# Patient Record
Sex: Female | Born: 1935 | Race: White | Hispanic: No | Smoking: Never smoker
Health system: Southern US, Community
[De-identification: ages and names within clinical notes are randomized; demographics above are authoritative.]

## PROBLEM LIST (undated history)

## (undated) DIAGNOSIS — R296 Repeated falls: Secondary | ICD-10-CM

## (undated) DIAGNOSIS — I10 Essential (primary) hypertension: Secondary | ICD-10-CM

## (undated) DIAGNOSIS — I350 Nonrheumatic aortic (valve) stenosis: Secondary | ICD-10-CM

## (undated) DIAGNOSIS — N182 Chronic kidney disease, stage 2 (mild): Secondary | ICD-10-CM

## (undated) DIAGNOSIS — F0390 Unspecified dementia without behavioral disturbance: Secondary | ICD-10-CM

## (undated) DIAGNOSIS — C50919 Malignant neoplasm of unspecified site of unspecified female breast: Secondary | ICD-10-CM

## (undated) HISTORY — PX: THYROIDECTOMY: SHX17

## (undated) HISTORY — PX: BREAST LUMPECTOMY: SHX2

## (undated) HISTORY — PX: CHOLECYSTECTOMY: SHX55

## (undated) HISTORY — PX: KNEE SURGERY: SHX244

---

## 2015-10-19 ENCOUNTER — Emergency Department: Payer: Medicare HMO

## 2015-10-19 ENCOUNTER — Emergency Department
Admission: EM | Admit: 2015-10-19 | Discharge: 2015-10-19 | Disposition: A | Discharge status: Home / Self Care | Payer: Medicare HMO | Attending: Student | Admitting: Student

## 2015-10-19 ENCOUNTER — Encounter: Payer: Self-pay | Admitting: *Deleted

## 2015-10-19 DIAGNOSIS — R1032 Left lower quadrant pain: Secondary | ICD-10-CM | POA: Diagnosis not present

## 2015-10-19 DIAGNOSIS — C801 Malignant (primary) neoplasm, unspecified: Secondary | ICD-10-CM | POA: Insufficient documentation

## 2015-10-19 DIAGNOSIS — I10 Essential (primary) hypertension: Secondary | ICD-10-CM | POA: Diagnosis not present

## 2015-10-19 HISTORY — DX: Essential (primary) hypertension: I10

## 2015-10-19 LAB — CBC
HCT: 45.1 % (ref 35.0–47.0)
HEMOGLOBIN: 15.2 g/dL (ref 12.0–16.0)
MCH: 29.6 pg (ref 26.0–34.0)
MCHC: 33.8 g/dL (ref 32.0–36.0)
MCV: 87.6 fL (ref 80.0–100.0)
Platelets: 151 10*3/uL (ref 150–440)
RBC: 5.15 MIL/uL (ref 3.80–5.20)
RDW: 13.9 % (ref 11.5–14.5)
WBC: 10.6 10*3/uL (ref 3.6–11.0)

## 2015-10-19 LAB — COMPREHENSIVE METABOLIC PANEL
ALBUMIN: 4.6 g/dL (ref 3.5–5.0)
ALK PHOS: 46 U/L (ref 38–126)
ALT: 25 U/L (ref 14–54)
ANION GAP: 8 (ref 5–15)
AST: 27 U/L (ref 15–41)
BILIRUBIN TOTAL: 0.7 mg/dL (ref 0.3–1.2)
BUN: 27 mg/dL — ABNORMAL HIGH (ref 6–20)
CALCIUM: 10.3 mg/dL (ref 8.9–10.3)
CO2: 26 mmol/L (ref 22–32)
Chloride: 105 mmol/L (ref 101–111)
Creatinine, Ser: 1.27 mg/dL — ABNORMAL HIGH (ref 0.44–1.00)
GFR calc Af Amer: 43 mL/min — ABNORMAL LOW (ref >60)
GFR, EST NON AFRICAN AMERICAN: 37 mL/min — AB (ref >60)
GLUCOSE: 115 mg/dL — AB (ref 65–99)
Potassium: 4.1 mmol/L (ref 3.5–5.1)
Sodium: 139 mmol/L (ref 135–145)
TOTAL PROTEIN: 7.2 g/dL (ref 6.5–8.1)

## 2015-10-19 LAB — URINALYSIS COMPLETE WITH MICROSCOPIC (ARMC ONLY)
Bacteria, UA: NONE SEEN
Bilirubin Urine: NEGATIVE
GLUCOSE, UA: NEGATIVE mg/dL
Hgb urine dipstick: NEGATIVE
KETONES UR: NEGATIVE mg/dL
NITRITE: NEGATIVE
Protein, ur: 30 mg/dL — AB
SPECIFIC GRAVITY, URINE: 1.008 (ref 1.005–1.030)
Squamous Epithelial / LPF: NONE SEEN
pH: 6 (ref 5.0–8.0)

## 2015-10-19 LAB — LIPASE, BLOOD: Lipase: 41 U/L (ref 11–51)

## 2015-10-19 LAB — TROPONIN I: Troponin I: <0.03 ng/mL (ref <0.031)

## 2015-10-19 MED ORDER — IOPAMIDOL (ISOVUE-300) INJECTION 61%
75.0000 mL | Freq: Once | INTRAVENOUS | Status: AC | PRN
  Administered 2015-10-19: 75 mL via INTRAVENOUS

## 2015-10-19 MED ORDER — DIATRIZOATE MEGLUMINE & SODIUM 66-10 % PO SOLN
15.0000 mL | Freq: Once | ORAL | Status: AC
  Administered 2015-10-19: 15 mL via ORAL

## 2015-10-19 MED ORDER — SODIUM CHLORIDE 0.9 % IV BOLUS (SEPSIS)
500.0000 mL | Freq: Once | INTRAVENOUS | Status: AC
  Administered 2015-10-19: 500 mL via INTRAVENOUS

## 2015-10-19 NOTE — ED Notes (Signed)
Pt woke up with right lower abdominal pain, pt denies any other symptoms,pt is in no distress

## 2015-10-19 NOTE — ED Provider Notes (Signed)
[facility] Emergency Department Provider Note   ____________________________________________  Time seen: Approximately 9:53 AM  I have reviewed the triage vital signs and the nursing notes.   HISTORY  Chief Complaint Abdominal Pain    HPI Kristin Brewer is a 82 y.o. female with history of hypertension, CKD, hyperlipidemia, kidney stones, who presents for evaluation of left lower abdominal pain today, gradual onset, constant, initially moderate, now mild, no modifying factors. No nausea, vomiting, diarrhea, fevers or chills. No chest pain or difficulty breathing. No fevers.    Past Medical History  Diagnosis Date  . Hypertension   . Kidney disease   . Renal insufficiency   . Cancer ([place])     There are no active problems to display for this patient.   History reviewed. No pertinent past surgical history.  No current outpatient prescriptions on file.  Allergies Penicillins  No family history on file.  Social History Social History  Substance Use Topics  . Smoking status: Never Smoker   . Smokeless tobacco: None  . Alcohol Use: Yes    Review of Systems Constitutional: No fever/chills Eyes: No visual changes. ENT: No sore throat. Cardiovascular: Denies chest pain. Respiratory: Denies shortness of breath. Gastrointestinal: + abdominal pain.  No nausea, no vomiting.  No diarrhea.  No constipation. Genitourinary: Negative for dysuria. Musculoskeletal: Negative for back pain. Skin: Negative for rash. Neurological: Negative for headaches, focal weakness or numbness.  10-point ROS otherwise negative.  ____________________________________________   PHYSICAL EXAM:  Filed Vitals:   10/19/15 0932 10/19/15 1200  BP: 189/59 134/43  Pulse: 51 48  Temp: 97.8 F (36.6 C)   TempSrc: Oral   Resp: 20 11  Height: 4\' 9"  (1.448 m)   Weight: 120 lb (54.432 kg)   SpO2: 98% 97%    VITAL SIGNS: ED Triage Vitals  Enc Vitals Group     BP  10/19/15 0932 189/59 mmHg     Pulse Rate 10/19/15 0932 51     Resp 10/19/15 0932 20     Temp --      Temp src --      SpO2 10/19/15 0932 98 %     Weight 10/19/15 0932 120 lb (54.432 kg)     Height 10/19/15 0932 4\' 9"  (1.448 m)     Head Cir --      Peak Flow --      Pain Score 10/19/15 0933 4     Pain Loc --      Pain Edu? --      Excl. in [place]? --     Constitutional: Alert and oriented. Well appearing and in no acute distress. Eyes: Conjunctivae are normal. PERRL. EOMI. Head: Atraumatic. Nose: No congestion/rhinnorhea. Mouth/Throat: Mucous membranes are moist.  Oropharynx non-erythematous. Neck: No stridor.  Supple without meningismus. Cardiovascular: Normal rate, regular rhythm. Grossly normal heart sounds.  Good peripheral circulation. Respiratory: Normal respiratory effort.  No retractions. Lungs CTAB. Gastrointestinal: Soft with moderate tenderness to palpation in the left lower quadrant and left mid abdomen. No CVA tenderness. Genitourinary: deferred Musculoskeletal: No lower extremity tenderness nor edema.  No joint effusions. Neurologic:  Normal speech and language. No gross focal neurologic deficits are appreciated. No gait instability. Skin:  Skin is warm, dry and intact. No rash noted. Psychiatric: Mood and affect are normal. Speech and behavior are normal.  ____________________________________________   LABS (all labs ordered are listed, but only abnormal results are displayed)  Labs Reviewed  COMPREHENSIVE METABOLIC PANEL - Abnormal; Notable  for the following:    Glucose, Bld 115 (*)    BUN 27 (*)    Creatinine, Ser 1.27 (*)    GFR calc non Af Amer 37 (*)    GFR calc Af Amer 43 (*)    All other components within normal limits  URINALYSIS COMPLETEWITH MICROSCOPIC ([facility] ONLY) - Abnormal; Notable for the following:    Color, Urine YELLOW (*)    APPearance CLEAR (*)    Protein, ur 30 (*)    Leukocytes, UA TRACE (*)    All other components within normal limits    LIPASE, BLOOD  CBC  TROPONIN I   ____________________________________________  EKG  ED ECG REPORT I, Joanne Gavel, the attending physician, personally viewed and interpreted this ECG.   Date: 10/19/2015  EKG Time: 09:36  Rate: 50  Rhythm: sinus bradycardia  Axis: normal  Intervals:none  ST&T Change: No acute ST elevation or ST depression.  ____________________________________________  RADIOLOGY  CT abdomen and pelvis IMPRESSION: 1. No explanation for patient's acute left lower quadrant abdominal pain. Specifically, no evidence of enteric or urinary obstruction. 2. Colonic diverticulosis without evidence of diverticulitis. 3. Extensive irregular predominantly calcified atherosclerotic plaque throughout the abdominal aorta and involving all major branch vessels of the abdominal aorta. The major branch vessels of the abdominal aorta appear patent though hemodynamically significant stenoses are not excluded on the basis of this non CTA examination. 4. Suspected hemodynamically significant narrowings involving the right common and the left superficial femoral arteries, incompletely evaluated on this non CTA examination. ____________________________________________   PROCEDURES  Procedure(s) performed: None  Critical Care performed: No  ____________________________________________   INITIAL IMPRESSION / ASSESSMENT AND PLAN / ED COURSE  Pertinent labs & imaging results that were available during my care of the patient were reviewed by me and considered in my medical decision making (see chart for details).  Kristin Brewer is a 82 y.o. female with history of hypertension, CKD, hyperlipidemia, kidney stones, who presents for evaluation of left lower abdominal pain today. On exam, she is well-appearing and in no acute distress, vital signs stable, she is afebrile. She does have tenderness to palpation throughout the left lower abdomen without rebound or guarding, we'll  obtain screening labs, CT of the abdomen and pelvis, reassess for disposition.  ----------------------------------------- 2:31 PM on 10/19/2015 ----------------------------------------- Patient continues to appear comfortable, she has no pain complaints at this time. I reviewed her labs, creatinine is mildly elevated at 1.27 however on chart review, this is her baseline. Unremarkable CMP, lipase, negative troponin. Urinalysis is not consistent with infection. CT scan of the abdomen and pelvis shows no acute cause for her pain and her pain has since resolved. We discussed that there are atherosclerotic calcifications throughout several arterieshowever the patient does not have any claudication, she has dopplered PT pulses bilaterally, and I encouraged her to follow up with vascular surgery for evaluation. I Discussed that the exact cause of her pain is not known and that she needs to follow up quickly with her primary care doctor, she has a previously scheduled appointment to be seen by Dr. Doy Hutching on 10/23/15. We discussed immediate return precautions, need for close follow-up and she and her daughter at bedside are comfortable with discharge plan. DC home.  ____________________________________________   FINAL CLINICAL IMPRESSION(S) / ED DIAGNOSES  Final diagnoses:  Left lower quadrant pain      NEW MEDICATIONS STARTED DURING THIS VISIT:  New Prescriptions   No medications on file  Note:  This document was prepared using Dragon voice recognition software and may include unintentional dictation errors.    Joanne Gavel, MD 10/19/15 1435

## 2016-06-14 ENCOUNTER — Encounter (INDEPENDENT_AMBULATORY_CARE_PROVIDER_SITE_OTHER): Payer: Self-pay | Admitting: Vascular Surgery

## 2016-06-14 ENCOUNTER — Other Ambulatory Visit (INDEPENDENT_AMBULATORY_CARE_PROVIDER_SITE_OTHER): Payer: Self-pay | Admitting: Vascular Surgery

## 2016-06-14 ENCOUNTER — Other Ambulatory Visit (INDEPENDENT_AMBULATORY_CARE_PROVIDER_SITE_OTHER): Payer: Medicare HMO

## 2016-06-14 ENCOUNTER — Ambulatory Visit (INDEPENDENT_AMBULATORY_CARE_PROVIDER_SITE_OTHER): Payer: Medicare HMO | Admitting: Vascular Surgery

## 2016-06-14 VITALS — BP 142/68 | HR 52 | Resp 15 | Ht 60.0 in | Wt 119.0 lb

## 2016-06-14 DIAGNOSIS — I10 Essential (primary) hypertension: Secondary | ICD-10-CM

## 2016-06-14 DIAGNOSIS — I779 Disorder of arteries and arterioles, unspecified: Secondary | ICD-10-CM | POA: Diagnosis not present

## 2016-06-14 DIAGNOSIS — I700 Atherosclerosis of aorta: Secondary | ICD-10-CM

## 2016-06-14 DIAGNOSIS — I739 Peripheral vascular disease, unspecified: Secondary | ICD-10-CM

## 2016-06-14 NOTE — Assessment & Plan Note (Signed)
This was checked about 5-6 months ago and found to be in the 40-59% range bilaterally. Scheduled to be rechecked in August. No new symptoms. Continue aspirin therapy.

## 2016-06-14 NOTE — Assessment & Plan Note (Signed)
blood pressure control important in reducing the progression of atherosclerotic disease. On appropriate oral medications.  

## 2016-06-14 NOTE — Assessment & Plan Note (Signed)
Her noninvasive studies today suggest possible greater than 50% stenosis of the mid and distal aorta and the proximal common iliac arteries, but good biphasic waveforms are seen beyond these areas. Her ABIs are 0.67 on the right and 0.70 on the left but reasonably good waveforms are seen distally. No lifestyle limiting symptoms. Plan to recheck in about 6 months. Continue aspirin therapy.

## 2016-06-14 NOTE — Progress Notes (Signed)
MRN : [id number]  Kristin Brewer is a 83 y.o. (Mar 09, 1927) female who presents with chief complaint of  Chief Complaint  Patient presents with  . Re-evaluation    6 month ultrasound follow up  .  History of Present Illness: Patient returns today in follow up of Peripheral vascular disease. She reports arthritic pain in her knees and occasional left hip and flank pain, but no lifestyle limiting claudication, ischemic rest pain, ulceration, or gangrenous changes to her lower extremities. Her noninvasive studies today suggest possible greater than 50% stenosis of the mid and distal aorta and the proximal common iliac arteries, but good biphasic waveforms are seen beyond these areas. Her ABIs are 0.67 on the right and 0.70 on the left but reasonably good waveforms are seen distally.  Current Outpatient Prescriptions  Medication Sig Dispense Refill  . amLODipine (NORVASC) 10 MG tablet Take by mouth.    Marland Kitchen aspirin 81 MG EC tablet Take 81 mg by mouth daily.  2  . donepezil (ARICEPT) 5 MG tablet Take by mouth.    Marland Kitchen HYDROcodone-acetaminophen (NORCO/VICODIN) 5-325 MG tablet Take by mouth.    . indapamide (LOZOL) 2.5 MG tablet TAKE 1 TABLET (2.5 MG TOTAL) BY MOUTH ONCE DAILY.  11  . KLOR-CON 10 10 MEQ tablet Take 10 mEq by mouth daily.  2  . LORazepam (ATIVAN) 1 MG tablet TAKE 1 TABLET BY MOUTH AT BEDTIME AS NEEDED    . magnesium oxide (MAG-OX) 400 (241.3 Mg) MG tablet Take 1 tablet by mouth daily.  5  . metoprolol succinate (TOPROL-XL) 100 MG 24 hr tablet TAKE 1 TABLET (100 MG TOTAL) BY MOUTH ONCE DAILY.  11  . ranitidine (ZANTAC) 150 MG tablet TAKE 1 TABLET (150 MG TOTAL) BY MOUTH 2 (TWO) TIMES DAILY.  11  . tolterodine (DETROL LA) 4 MG 24 hr capsule Take 4 mg by mouth daily.  11  . Vitamin D, Ergocalciferol, (DRISDOL) 50000 units CAPS capsule TAKE 1 CAPSULE BY MOUTH EVERY MONTH  3   No current facility-administered medications for this visit.     Past Medical History:  Diagnosis Date  . Cancer  ([place])   . Hypertension   . Kidney disease   . Renal insufficiency     No past surgical history on file.  Social History Social History  Substance Use Topics  . Smoking status: Never Smoker  . Smokeless tobacco: Never Used  . Alcohol use Yes     Family History No bleeding or clotting disorders  Allergies  Allergen Reactions  . Penicillins      REVIEW OF SYSTEMS (Negative unless checked)  Constitutional: [] Weight loss  [] Fever  [] Chills Cardiac: [] Chest pain   [] Chest pressure   [] Palpitations   [] Shortness of breath when laying flat   [] Shortness of breath at rest   [] Shortness of breath with exertion. Vascular:  [] Pain in legs with walking   [] Pain in legs at rest   [] Pain in legs when laying flat   [x] Claudication   [] Pain in feet when walking  [] Pain in feet at rest  [] Pain in feet when laying flat   [] History of DVT   [] Phlebitis   [] Swelling in legs   [] Varicose veins   [] Non-healing ulcers Pulmonary:   [] Uses home oxygen   [] Productive cough   [] Hemoptysis   [] Wheeze  [] COPD   [] Asthma Neurologic:  [] Dizziness  [] Blackouts   [] Seizures   [] History of stroke   [] History of TIA  [] Aphasia   [] Temporary blindness   []   Dysphagia   [] Weakness or numbness in arms   [] Weakness or numbness in legs Musculoskeletal:  [x] Arthritis   [] Joint swelling   [x] Joint pain   [] Low back pain Hematologic:  [] Easy bruising  [] Easy bleeding   [] Hypercoagulable state   [] Anemic   Gastrointestinal:  [] Blood in stool   [] Vomiting blood  [] Gastroesophageal reflux/heartburn   [] Abdominal pain Genitourinary:  [] Chronic kidney disease   [] Difficult urination  [] Frequent urination  [] Burning with urination   [] Hematuria Skin:  [] Rashes   [] Ulcers   [] Wounds Psychological:  [] History of anxiety   []  History of major depression.  Physical Examination  BP (!) 142/68 (BP Location: Right Arm)   Pulse (!) 52   Resp 15   Ht 5' (1.524 m)   Wt 119 lb (54 kg)   BMI 23.24 kg/m  Gen:  WD/WN, NAD. Appears  younger than stated age Head: [place]/AT, No temporalis wasting. Ear/Nose/Throat: Hearing grossly intact, nares w/o erythema or drainage, trachea midline Eyes: Conjunctiva clear. Sclera non-icteric Neck: Supple.  No JVD.  Pulmonary:  Good air movement, no use of accessory muscles.  Cardiac: RRR, normal S1, S2 Vascular:  Vessel Right Left  Radial Palpable Palpable  Ulnar Palpable Palpable  Brachial Palpable Palpable  Carotid Palpable, without bruit Palpable, without bruit  Aorta Not palpable N/A  Femoral 1+ Palpable 1+ Palpable  Popliteal 1+ Palpable Not Palpable  PT 1+ Palpable 1+ Palpable  DP 1+ Palpable 1+ Palpable   Gastrointestinal: soft, non-tender/non-distended. No guarding/reflex.  Musculoskeletal: M/S 5/5 throughout.  No deformity or atrophy.  Neurologic: Sensation grossly intact in extremities.  Symmetrical.  Speech is fluent.  Psychiatric: Judgment intact, Mood & affect appropriate for pt's clinical situation. Dermatologic: No rashes or ulcers noted.  No cellulitis or open wounds. Lymph : No Cervical, Axillary, or Inguinal lymphadenopathy.      Labs No results found for this or any previous visit (from the past 2160 hour(s)).  Radiology No results found.    Assessment/Plan  Essential hypertension, benign blood pressure control important in reducing the progression of atherosclerotic disease. On appropriate oral medications.   Carotid artery disease ([place]) This was checked about 5-6 months ago and found to be in the 40-59% range bilaterally. Scheduled to be rechecked in August. No new symptoms. Continue aspirin therapy.  Aortic atherosclerosis ([facility]) Her noninvasive studies today suggest possible greater than 50% stenosis of the mid and distal aorta and the proximal common iliac arteries, but good biphasic waveforms are seen beyond these areas. Her ABIs are 0.67 on the right and 0.70 on the left but reasonably good waveforms are seen distally. No lifestyle limiting  symptoms. Plan to recheck in about 6 months. Continue aspirin therapy.  PVD (peripheral vascular disease) ([place]) Her noninvasive studies today suggest possible greater than 50% stenosis of the mid and distal aorta and the proximal common iliac arteries, but good biphasic waveforms are seen beyond these areas. Her ABIs are 0.67 on the right and 0.70 on the left but reasonably good waveforms are seen distally. No lifestyle limiting symptoms. Plan to recheck in about 6 months. Continue aspirin therapy.    Leotis Pain, MD  06/14/2016 9:53 AM    This note was created with Dragon medical transcription system.  Any errors from dictation are purely unintentional

## 2016-11-26 ENCOUNTER — Inpatient Hospital Stay
Admission: EM | Admit: 2016-11-26 | Discharge: 2016-12-03 | DRG: 557 | Disposition: A | Discharge status: Skilled Nursing Facility | Payer: Medicare HMO | Attending: Internal Medicine | Admitting: Internal Medicine

## 2016-11-26 ENCOUNTER — Emergency Department: Payer: Medicare HMO

## 2016-11-26 DIAGNOSIS — I5033 Acute on chronic diastolic (congestive) heart failure: Secondary | ICD-10-CM | POA: Diagnosis present

## 2016-11-26 DIAGNOSIS — F0390 Unspecified dementia without behavioral disturbance: Secondary | ICD-10-CM | POA: Diagnosis present

## 2016-11-26 DIAGNOSIS — I129 Hypertensive chronic kidney disease with stage 1 through stage 4 chronic kidney disease, or unspecified chronic kidney disease: Secondary | ICD-10-CM | POA: Diagnosis present

## 2016-11-26 DIAGNOSIS — F0280 Dementia in other diseases classified elsewhere without behavioral disturbance: Secondary | ICD-10-CM | POA: Diagnosis not present

## 2016-11-26 DIAGNOSIS — M6282 Rhabdomyolysis: Principal | ICD-10-CM

## 2016-11-26 DIAGNOSIS — Z7982 Long term (current) use of aspirin: Secondary | ICD-10-CM | POA: Diagnosis not present

## 2016-11-26 DIAGNOSIS — E876 Hypokalemia: Secondary | ICD-10-CM

## 2016-11-26 DIAGNOSIS — R32 Unspecified urinary incontinence: Secondary | ICD-10-CM | POA: Diagnosis present

## 2016-11-26 DIAGNOSIS — Z853 Personal history of malignant neoplasm of breast: Secondary | ICD-10-CM | POA: Diagnosis not present

## 2016-11-26 DIAGNOSIS — N179 Acute kidney failure, unspecified: Secondary | ICD-10-CM | POA: Diagnosis present

## 2016-11-26 DIAGNOSIS — N182 Chronic kidney disease, stage 2 (mild): Secondary | ICD-10-CM | POA: Diagnosis not present

## 2016-11-26 DIAGNOSIS — R778 Other specified abnormalities of plasma proteins: Secondary | ICD-10-CM | POA: Insufficient documentation

## 2016-11-26 DIAGNOSIS — T796XXS Traumatic ischemia of muscle, sequela: Secondary | ICD-10-CM | POA: Diagnosis not present

## 2016-11-26 DIAGNOSIS — T796XXA Traumatic ischemia of muscle, initial encounter: Secondary | ICD-10-CM

## 2016-11-26 DIAGNOSIS — Z8249 Family history of ischemic heart disease and other diseases of the circulatory system: Secondary | ICD-10-CM | POA: Diagnosis not present

## 2016-11-26 DIAGNOSIS — D72829 Elevated white blood cell count, unspecified: Secondary | ICD-10-CM | POA: Diagnosis not present

## 2016-11-26 DIAGNOSIS — I340 Nonrheumatic mitral (valve) insufficiency: Secondary | ICD-10-CM | POA: Diagnosis not present

## 2016-11-26 DIAGNOSIS — W19XXXA Unspecified fall, initial encounter: Secondary | ICD-10-CM

## 2016-11-26 DIAGNOSIS — G308 Other Alzheimer's disease: Secondary | ICD-10-CM | POA: Diagnosis not present

## 2016-11-26 DIAGNOSIS — W1830XA Fall on same level, unspecified, initial encounter: Secondary | ICD-10-CM | POA: Diagnosis present

## 2016-11-26 DIAGNOSIS — F329 Major depressive disorder, single episode, unspecified: Secondary | ICD-10-CM | POA: Diagnosis present

## 2016-11-26 DIAGNOSIS — Z66 Do not resuscitate: Secondary | ICD-10-CM | POA: Diagnosis present

## 2016-11-26 DIAGNOSIS — R531 Weakness: Secondary | ICD-10-CM | POA: Diagnosis present

## 2016-11-26 DIAGNOSIS — I248 Other forms of acute ischemic heart disease: Secondary | ICD-10-CM | POA: Diagnosis present

## 2016-11-26 DIAGNOSIS — I214 Non-ST elevation (NSTEMI) myocardial infarction: Secondary | ICD-10-CM

## 2016-11-26 DIAGNOSIS — R7989 Other specified abnormal findings of blood chemistry: Secondary | ICD-10-CM | POA: Insufficient documentation

## 2016-11-26 DIAGNOSIS — R296 Repeated falls: Secondary | ICD-10-CM | POA: Diagnosis present

## 2016-11-26 DIAGNOSIS — R748 Abnormal levels of other serum enzymes: Secondary | ICD-10-CM | POA: Diagnosis not present

## 2016-11-26 HISTORY — DX: Chronic kidney disease, stage 2 (mild): N18.2

## 2016-11-26 HISTORY — DX: Unspecified dementia, unspecified severity, without behavioral disturbance, psychotic disturbance, mood disturbance, and anxiety: F03.90

## 2016-11-26 HISTORY — DX: Malignant neoplasm of unspecified site of unspecified female breast: C50.919

## 2016-11-26 HISTORY — DX: Repeated falls: R29.6

## 2016-11-26 LAB — URINALYSIS, COMPLETE (UACMP) WITH MICROSCOPIC
BACTERIA UA: NONE SEEN
BILIRUBIN URINE: NEGATIVE
Glucose, UA: NEGATIVE mg/dL
Ketones, ur: 5 mg/dL — AB
LEUKOCYTES UA: NEGATIVE
NITRITE: NEGATIVE
Protein, ur: 100 mg/dL — AB
SPECIFIC GRAVITY, URINE: 1.014 (ref 1.005–1.030)
Squamous Epithelial / LPF: NONE SEEN
pH: 5 (ref 5.0–8.0)

## 2016-11-26 LAB — LIPID PANEL
CHOLESTEROL: 296 mg/dL — AB (ref 0–200)
HDL: 86 mg/dL (ref >40)
LDL Cholesterol: 187 mg/dL — ABNORMAL HIGH (ref 0–99)
Total CHOL/HDL Ratio: 3.4 RATIO
Triglycerides: 116 mg/dL (ref <150)
VLDL: 23 mg/dL (ref 0–40)

## 2016-11-26 LAB — BASIC METABOLIC PANEL
ANION GAP: 12 (ref 5–15)
BUN: 24 mg/dL — ABNORMAL HIGH (ref 6–20)
CALCIUM: 8.7 mg/dL — AB (ref 8.9–10.3)
CHLORIDE: 108 mmol/L (ref 101–111)
CO2: 21 mmol/L — AB (ref 22–32)
Creatinine, Ser: 1.11 mg/dL — ABNORMAL HIGH (ref 0.44–1.00)
GFR calc non Af Amer: 43 mL/min — ABNORMAL LOW (ref >60)
GFR, EST AFRICAN AMERICAN: 50 mL/min — AB (ref >60)
Glucose, Bld: 112 mg/dL — ABNORMAL HIGH (ref 65–99)
Potassium: 3 mmol/L — ABNORMAL LOW (ref 3.5–5.1)
Sodium: 141 mmol/L (ref 135–145)

## 2016-11-26 LAB — PROTIME-INR
INR: 0.98
PROTHROMBIN TIME: 13 s (ref 11.4–15.2)

## 2016-11-26 LAB — TROPONIN I
TROPONIN I: 4.78 ng/mL — AB (ref <0.03)
Troponin I: 5.55 ng/mL (ref <0.03)

## 2016-11-26 LAB — CBC
HEMATOCRIT: 44.3 % (ref 35.0–47.0)
HEMOGLOBIN: 14.8 g/dL (ref 12.0–16.0)
MCH: 28.8 pg (ref 26.0–34.0)
MCHC: 33.4 g/dL (ref 32.0–36.0)
MCV: 86.3 fL (ref 80.0–100.0)
Platelets: 175 10*3/uL (ref 150–440)
RBC: 5.14 MIL/uL (ref 3.80–5.20)
RDW: 14.8 % — ABNORMAL HIGH (ref 11.5–14.5)
WBC: 13.9 10*3/uL — AB (ref 3.6–11.0)

## 2016-11-26 LAB — CK: CK TOTAL: 2108 U/L — AB (ref 38–234)

## 2016-11-26 LAB — APTT: APTT: 26 s (ref 24–36)

## 2016-11-26 MED ORDER — HEPARIN (PORCINE) IN NACL 100-0.45 UNIT/ML-% IJ SOLN
750.0000 [IU]/h | INTRAMUSCULAR | Status: DC
  Administered 2016-11-26: 650 [IU]/h via INTRAVENOUS
  Administered 2016-11-28: 750 [IU]/h via INTRAVENOUS
  Filled 2016-11-26 (×2): qty 250

## 2016-11-26 MED ORDER — SODIUM CHLORIDE 0.9 % IV SOLN
INTRAVENOUS | Status: AC
  Administered 2016-11-27 (×2): via INTRAVENOUS

## 2016-11-26 MED ORDER — HEPARIN BOLUS VIA INFUSION
3300.0000 [IU] | Freq: Once | INTRAVENOUS | Status: AC
  Administered 2016-11-26: 3300 [IU] via INTRAVENOUS
  Filled 2016-11-26: qty 3300

## 2016-11-26 MED ORDER — ASPIRIN 81 MG PO CHEW
324.0000 mg | CHEWABLE_TABLET | Freq: Once | ORAL | Status: AC
  Administered 2016-11-26: 324 mg via ORAL
  Filled 2016-11-26: qty 4

## 2016-11-26 NOTE — ED Notes (Signed)
Heparin verified by Anderson Malta RN

## 2016-11-26 NOTE — ED Notes (Signed)
Nurse and Clarene Critchley, RN performed in & out no pt with 29fr. Cath.

## 2016-11-26 NOTE — ED Notes (Signed)
Dr Kerman Passey notified of 4.78 troponin - order received to obtain ekg

## 2016-11-26 NOTE — ED Provider Notes (Addendum)
[facility] Emergency Department Provider Note  Time seen: 6:44 PM  I have reviewed the triage vital signs and the nursing notes.   HISTORY  Chief Complaint Fall    HPI Kristin Brewer is a 83 y.o. female with a past medical history of hypertension, chronic kidney disease, presents to the emergency department after a fall. According to the daughter with whom the patient lives with found the patient on the floor around 3 or 4 PM today and believes she likely fell this morning. They were able to help the patient back into her bed however upon standing out of bed she once again fell down to the floor. Denies hitting her head or loss of consciousness. Daughter states the patient does fall every month or so but appears more weak today than normal. Patient states she has not eaten or drink in today.Patient's only complaint is mild right knee pain.  Past Medical History:  Diagnosis Date  . Cancer ([place])   . Hypertension   . Kidney disease   . Renal insufficiency     Patient Active Problem List   Diagnosis Date Noted  . Essential hypertension, benign 06/14/2016  . Carotid artery disease ([place]) 06/14/2016  . Aortic atherosclerosis ([place]) 06/14/2016  . PVD (peripheral vascular disease) ([place]) 06/14/2016    Past Surgical History:  Procedure Laterality Date  . KNEE SURGERY      Prior to Admission medications   Medication Sig Start Date End Date Taking? Authorizing Provider  amLODipine (NORVASC) 10 MG tablet Take by mouth. 07/21/15   [provider]  aspirin 81 MG EC tablet Take 81 mg by mouth daily. 03/31/16   [provider]  donepezil (ARICEPT) 5 MG tablet Take by mouth. 02/12/16 02/11/17  [provider]  HYDROcodone-acetaminophen (NORCO/VICODIN) 5-325 MG tablet Take by mouth. 05/08/16   [provider]  indapamide (LOZOL) 2.5 MG tablet TAKE 1 TABLET (2.5 MG TOTAL) BY MOUTH ONCE DAILY. 03/31/16   [provider]  KLOR-CON 10  10 MEQ tablet Take 10 mEq by mouth daily. 03/31/16   [provider]  LORazepam (ATIVAN) 1 MG tablet TAKE 1 TABLET BY MOUTH AT BEDTIME AS NEEDED 03/23/16   [provider]  magnesium oxide (MAG-OX) 400 (241.3 Mg) MG tablet Take 1 tablet by mouth daily. 03/25/16   [provider]  metoprolol succinate (TOPROL-XL) 100 MG 24 hr tablet TAKE 1 TABLET (100 MG TOTAL) BY MOUTH ONCE DAILY. 05/31/16   [provider]  ranitidine (ZANTAC) 150 MG tablet TAKE 1 TABLET (150 MG TOTAL) BY MOUTH 2 (TWO) TIMES DAILY. 03/31/16   [provider]  tolterodine (DETROL LA) 4 MG 24 hr capsule Take 4 mg by mouth daily. 05/31/16   [provider]  Vitamin D, Ergocalciferol, (DRISDOL) 50000 units CAPS capsule TAKE 1 CAPSULE BY MOUTH EVERY MONTH 06/01/16   [provider]    Allergies  Allergen Reactions  . Penicillins     History reviewed. No pertinent family history.  Social History Social History  Substance Use Topics  . Smoking status: Never Smoker  . Smokeless tobacco: Never Used  . Alcohol use Yes    Review of Systems Constitutional: Negative for fever. Negative for LOC. Cardiovascular: Negative for chest pain. Respiratory: Negative for shortness of breath. Gastrointestinal: Negative for abdominal pain, vomiting and diarrhea. Genitourinary: Negative for dysuria. Musculoskeletal: Right knee pain. Skin: Negative for rash. Neurological: Negative for headache All other ROS negative  ____________________________________________   PHYSICAL EXAM:  VITAL  SIGNS: ED Triage Vitals  Enc Vitals Group     BP 11/26/16 1814 137/71     Pulse Rate 11/26/16 1814 71     Resp 11/26/16 1814 20     Temp 11/26/16 1814 99 F (37.2 C)     Temp Source 11/26/16 1814 Oral     SpO2 11/26/16 1814 94 %     Weight --      Height --      Head Circumference --      Peak Flow --      Pain Score 11/26/16 1813 7     Pain Loc --      Pain Edu? --      Excl. in  [place]? --     Constitutional: Alert, no distress, mild confusion and disoriented to time which the daughter states is baseline Eyes: Normal exam ENT   Head: Normocephalic and atraumatic.   Mouth/Throat: Mucous membranes are moist. Cardiovascular: Normal rate, regular rhythm. No murmur Respiratory: Normal respiratory effort without tachypnea nor retractions. Breath sounds are clear  Gastrointestinal: Soft and nontender. No distention.   Musculoskeletal: Mild tenderness of the right hip although good range of motion in the right hip. Mild tenderness of the right knee but again good range of motion in the right knee. Neurovascular intact distally. Extremities are otherwise atraumatic. No C-spine tenderness mild mid T-spine tenderness, no L-spine tenderness. Neurologic:  Normal speech and language. No gross focal neurologic deficits. Moves all extremity is well. Skin:  Skin is warm, dry and intact.  Psychiatric: Mood and affect are normal. Speech and behavior are normal.   ____________________________________________   RADIOLOGY  X-rays negative for acute injury.  ____________________________________________   INITIAL IMPRESSION / ASSESSMENT AND PLAN / ED COURSE  Pertinent labs & imaging results that were available during my care of the patient were reviewed by me and considered in my medical decision making (see chart for details).  Patient was sent to the emergency department after 2 falls with generalized weakness. We will check labs including blood work and urinalysis. Patient was down for an extended period of time today we will check a creatinine kinase. We will IV hydrate, obtain imaging of the chest right knee and pelvis. Patient and daughter are agreeable to this plan. Currently the patient appears well, no distress is calm, comfortable and cooperative.  Patient's labs are positive for an NSTEMI. Troponin significantly elevated. We'll dose aspirin obtain a CT scan of the head  given her recent fall as precaution. Start on heparin if CT is negative. EKG shows no concerning findings at this time. CK is also elevated consistent with rhabdomyolysis.  CRITICAL CARE Performed by: Harvest Dark   Total critical care time: 30 minutes  Critical care time was exclusive of separately billable procedures and treating other patients.  Critical care was necessary to treat or prevent imminent or life-threatening deterioration.  Critical care was time spent personally by me on the following activities: development of treatment plan with patient and/or surrogate as well as nursing, discussions with consultants, evaluation of patient's response to treatment, examination of patient, obtaining history from patient or surrogate, ordering and performing treatments and interventions, ordering and review of laboratory studies, ordering and review of radiographic studies, pulse oximetry and re-evaluation of patient's condition.  EKG reviewed and interpreted by myself shows normal sinus rhythm at 66 bpm, narrow QRS, normal axis, normal intervals, nonspecific but no concerning ST changes. ____________________________________________   FINAL CLINICAL IMPRESSION(S) / ED DIAGNOSES  Fall  Generalized weakness NSTEMI Rhabdomyolysis    Harvest Dark, MD 11/26/16 Langston Reusing    Harvest Dark, MD 11/26/16 Colbert Coyer    Harvest Dark, MD 11/26/16 2001

## 2016-11-26 NOTE — H&P (Signed)
[facility] [place] NAME: Kristin Brewer    MR#:  [id number]  DATE OF BIRTH:  October 21, 1926  DATE OF ADMISSION:  11/26/2016  PRIMARY CARE PHYSICIAN: Idelle Crouch, MD   REQUESTING/REFERRING PHYSICIAN: [email or web address]  CHIEF COMPLAINT:   Chief Complaint  Patient presents with  . Fall    HISTORY OF PRESENT ILLNESS: Kristin Brewer  is a 83 y.o. female with a known history of Breast cancer, hypertension, kidney disease, renal insufficiency- lives with her daughter's. Today wanted to get up and go to the bathroom where she fell down daughter was not at home and patient could not get up from there until she returned back home in the afternoon. Patient denies any loss of consciousness, palpitation, chest pain, shortness of breath, focal weakness with this episode. Daughter helped her to get up and go to the bed where patient stayed for half an hour then again tried to get up and go somewhere and she fell down concerned with this daughter brought her to the emergency room. Her urinalysis is negative but she is noted to have elevated CK level and her troponin is also high. So she started on heparin IV drip by ER physician and given to hospitalist service for further management.  PAST MEDICAL HISTORY:   Past Medical History:  Diagnosis Date  . Cancer ([place])   . Hypertension   . Kidney disease   . Renal insufficiency     PAST SURGICAL HISTORY: Past Surgical History:  Procedure Laterality Date  . KNEE SURGERY      SOCIAL HISTORY:  Social History  Substance Use Topics  . Smoking status: Never Smoker  . Smokeless tobacco: Never Used  . Alcohol use Yes    FAMILY HISTORY:  Family History  Problem Relation Age of Onset  . CAD Mother   . CAD Father     DRUG ALLERGIES:  Allergies  Allergen Reactions  . Penicillins     Has patient had a PCN reaction causing immediate rash, facial/tongue/throat swelling, SOB or lightheadedness with hypotension:  Unknown Has patient had a PCN reaction causing severe rash involving mucus membranes or skin necrosis: Unknown Has patient had a PCN reaction that required hospitalization: No Has patient had a PCN reaction occurring within the last 10 years: No If all of the above answers are "NO", then may proceed with Cephalosporin use.   . Ciprofloxacin Rash    REVIEW OF SYSTEMS:   CONSTITUTIONAL: No fever,positive for fatigue or weakness.  EYES: No blurred or double vision.  EARS, NOSE, AND THROAT: No tinnitus or ear pain.  RESPIRATORY: No cough, shortness of breath, wheezing or hemoptysis.  CARDIOVASCULAR: No chest pain, orthopnea, edema.  GASTROINTESTINAL: No nausea, vomiting, diarrhea or abdominal pain.  GENITOURINARY: No dysuria, hematuria.  ENDOCRINE: No polyuria, nocturia,  HEMATOLOGY: No anemia, easy bruising or bleeding SKIN: No rash or lesion. MUSCULOSKELETAL: No joint pain or arthritis.   NEUROLOGIC: No tingling, numbness, weakness.  PSYCHIATRY: No anxiety or depression.   MEDICATIONS AT HOME:  Prior to Admission medications   Medication Sig Start Date End Date Taking? Authorizing Provider  amLODipine (NORVASC) 5 MG tablet Take 5 mg by mouth daily.  07/21/15  Yes [provider]  aspirin 81 MG EC tablet Take 81 mg by mouth daily. 03/31/16  Yes [provider]  CRANBERRY PO Take 2 tablets by mouth daily.   Yes [provider]  donepezil (ARICEPT) 5 MG tablet Take 5 mg by mouth  at bedtime.  02/12/16 02/11/17 Yes [provider]  escitalopram (LEXAPRO) 10 MG tablet Take 10 mg by mouth daily.   Yes [provider]  HYDROcodone-acetaminophen (NORCO/VICODIN) 5-325 MG tablet Take 1 tablet by mouth every 6 (six) hours as needed.  05/08/16  Yes [provider]  indapamide (LOZOL) 2.5 MG tablet TAKE 1 TABLET (2.5 MG TOTAL) BY MOUTH ONCE DAILY. 03/31/16  Yes [provider]  KLOR-CON 10 10 MEQ tablet Take 20 mEq by mouth daily.   03/31/16  Yes [provider]  LORazepam (ATIVAN) 1 MG tablet TAKE 1 TABLET BY MOUTH AT BEDTIME AS NEEDED 03/23/16  Yes [provider]  magnesium oxide (MAG-OX) 400 (241.3 Mg) MG tablet Take 1 tablet by mouth daily. 03/25/16  Yes [provider]  metoprolol succinate (TOPROL-XL) 100 MG 24 hr tablet TAKE 1 TABLET (100 MG TOTAL) BY MOUTH ONCE DAILY. 05/31/16  Yes [provider]  ranitidine (ZANTAC) 150 MG tablet TAKE 1 TABLET (150 MG TOTAL) BY MOUTH 2 (TWO) TIMES DAILY. 03/31/16  Yes [provider]  tolterodine (DETROL LA) 4 MG 24 hr capsule Take 4 mg by mouth daily. 05/31/16  Yes [provider]  Vitamin D, Ergocalciferol, (DRISDOL) 50000 units CAPS capsule TAKE 1 CAPSULE BY MOUTH EVERY MONTH 06/01/16  Yes [provider]      PHYSICAL EXAMINATION:   VITAL SIGNS: Blood pressure (!) 158/48, pulse 70, temperature 99 F (37.2 C), temperature source Oral, resp. rate 20, weight 55.3 kg (122 lb), SpO2 97 %.  GENERAL:  83 y.o.-year-old patient lying in the bed with no acute distress.  EYES: Pupils equal, round, reactive to light and accommodation. No scleral icterus. Extraocular muscles intact.  HEENT: Head atraumatic, normocephalic. Oropharynx and nasopharynx clear.  NECK:  Supple, no jugular venous distention. No thyroid enlargement, no tenderness.  LUNGS: Normal breath sounds bilaterally, no wheezing, rales,rhonchi or crepitation. No use of accessory muscles of respiration.  CARDIOVASCULAR: S1, S2 normal. Systolic murmurs.  ABDOMEN: Soft, nontender, nondistended. Bowel sounds present. No organomegaly or mass.  EXTREMITIES: No pedal edema, cyanosis, or clubbing.  NEUROLOGIC: Cranial nerves II through XII are intact. Muscle strength 5/5 in all extremities. Sensation intact. Gait not checked.  PSYCHIATRIC: The patient is alert and oriented x 3.  SKIN: No obvious rash, lesion, or ulcer.   LABORATORY PANEL:   CBC  Recent Labs Lab  11/26/16 1850  WBC 13.9*  HGB 14.8  HCT 44.3  PLT 175  MCV 86.3  MCH 28.8  MCHC 33.4  RDW 14.8*   ------------------------------------------------------------------------------------------------------------------  Chemistries   Recent Labs Lab 11/26/16 1850  NA 141  K 3.0*  CL 108  CO2 21*  GLUCOSE 112*  BUN 24*  CREATININE 1.11*  CALCIUM 8.7*   ------------------------------------------------------------------------------------------------------------------ estimated creatinine clearance is 27.3 mL/min (A) (by C-G formula based on SCr of 1.11 mg/dL (H)). ------------------------------------------------------------------------------------------------------------------ No results for input(s): TSH, T4TOTAL, T3FREE, THYROIDAB in the last 72 hours.  Invalid input(s): FREET3   Coagulation profile No results for input(s): INR, PROTIME in the last 168 hours. ------------------------------------------------------------------------------------------------------------------- No results for input(s): DDIMER in the last 72 hours. -------------------------------------------------------------------------------------------------------------------  Cardiac Enzymes  Recent Labs Lab 11/26/16 1850  TROPONINI 4.78*   ------------------------------------------------------------------------------------------------------------------ Invalid input(s): POCBNP  ---------------------------------------------------------------------------------------------------------------  Urinalysis    Component Value Date/Time   COLORURINE YELLOW (A) 11/26/2016 1820   APPEARANCEUR CLEAR (A) 11/26/2016 1820   LABSPEC 1.014 11/26/2016 1820   PHURINE 5.0 11/26/2016 1820   GLUCOSEU NEGATIVE 11/26/2016 1820   HGBUR  MODERATE (A) 11/26/2016 1820   BILIRUBINUR NEGATIVE 11/26/2016 1820   KETONESUR 5 (A) 11/26/2016 1820   PROTEINUR 100 (A) 11/26/2016 1820   NITRITE NEGATIVE 11/26/2016 1820    LEUKOCYTESUR NEGATIVE 11/26/2016 1820     RADIOLOGY: Dg Chest 2 View  Result Date: 11/26/2016 CLINICAL DATA:  Back pain after fall EXAM: CHEST  2 VIEW COMPARISON:  CXR and thoracic spine radiograph report from 07/26/2016 FINDINGS: Heart is top-normal in size. There is aortic atherosclerosis without aneurysm. The lungs are clear without pneumothorax or pulmonary consolidations. Axillary clips are seen on the right. Degenerative disc disease consistent with spondylosis noted of the included lower cervical spine and upper thoracic spine. No acute appearing fracture nor bone destruction. IMPRESSION: 1. No active cardiopulmonary disease. 2. Degenerative changes are seen along the included lower cervical and upper thoracic spine. 3. Aortic atherosclerosis. Electronically Signed   By: Ashley Royalty M.D.   On: 11/26/2016 19:14   Dg Pelvis 1-2 Views  Result Date: 11/26/2016 CLINICAL DATA:  Patient found on floor around 4 p.m. Left hip tender to touch. EXAM: PELVIS - 1-2 VIEW COMPARISON:  CT 10/19/2015 FINDINGS: Lower lumbar degenerative disc disease from L4 through S1. The bony pelvis and hips appear intact without fracture or dislocations. No diastasis. Injection granulomas noted about the right hip, confirmed on prior CT. Bi-iliofemoral atherosclerotic calcifications are noted. IMPRESSION: Lower lumbar degenerative disc disease from L4 through S1. No acute fracture dislocations. Electronically Signed   By: Ashley Royalty M.D.   On: 11/26/2016 19:19   Ct Head Wo Contrast  Result Date: 11/26/2016 CLINICAL DATA:  Found on the floor today.  Some confusion. EXAM: CT HEAD WITHOUT CONTRAST TECHNIQUE: Contiguous axial images were obtained from the base of the skull through the vertex without intravenous contrast. COMPARISON:  None. FINDINGS: Brain: Generalized brain atrophy. Chronic small-vessel ischemic changes of the cerebral hemispheric white matter. No sign of acute infarction, mass lesion, hemorrhage, hydrocephalus  or extra-axial collection. Vascular: There is atherosclerotic calcification of the major vessels at the base of the brain. Skull: Normal Sinuses/Orbits: Clear/ normal Other: None significant IMPRESSION: No acute finding. Age related atrophy and chronic small-vessel ischemic changes of the cerebral hemispheric white matter. Electronically Signed   By: Nelson Chimes M.D.   On: 11/26/2016 20:08   Dg Knee Complete 4 Views Right  Result Date: 11/26/2016 CLINICAL DATA:  Patient was found down around 4 p.m. today by daughter. The patient is believed to have fallen this morning. EXAM: RIGHT KNEE - COMPLETE 4+ VIEW COMPARISON:  None. FINDINGS: No evidence of fracture, dislocation, or joint effusion. Intact total knee arthroplasty with patellar resurfacing. Popliteal and tibial arteriosclerosis is identified. Small phleboliths are seen along the anterior aspect of the thigh. No evidence of arthropathy or other focal bone abnormality. No soft tissue mass or significant hematoma identified. IMPRESSION: 1. Intact right knee arthroplasty without posttraumatic fracture nor dislocation. 2. No joint effusion. 3. No hardware failure. Electronically Signed   By: Ashley Royalty M.D.   On: 11/26/2016 19:17    EKG: Orders placed or performed during the hospital encounter of 11/26/16  . EKG 12-Lead  . EKG 12-Lead  . ED EKG  . ED EKG    IMPRESSION AND PLAN:  * Non-ST elevation MI   IV heparin drip started by ER,  Follow serial troponin, monitor on telemetry, check lipid panel and hemoglobin A1c   Get echocardiogram and cardiology consult.  * Rhabdomyolysis   Continue IV fluid and recheck tomorrow, follow  renal function closely.  * Fall   Once cardiac workup is finished she may need physical therapy evaluation.  * Hypertension   Continue metoprolol.  * Dementia   Continue Aricept.  All the records are reviewed and case discussed with ED provider. Management plans discussed with the patient, family and they are  in agreement.  CODE STATUS: DNR Code Status History    This patient does not have a recorded code status. Please follow your organizational policy for patients in this situation.    Advance Directive Documentation     Most Recent Value  Type of Advance Directive  Living will  Pre-existing out of facility DNR order (yellow form or pink MOST form)  -  "MOST" Form in Place?  -     Discussed with patient's daughter in the room.  TOTAL TIME TAKING CARE OF THIS PATIENT: 50 minutes.    Vaughan Basta M.D on 11/26/2016   Between 7am to 6pm - Pager - [phone]  After 6pm go to [email or web address] - password [email or web address] [facility]  Office  [phone]  CC: Primary care physician; Idelle Crouch, MD   Note: This dictation was prepared with Dragon dictation along with smaller phrase technology. Any transcriptional errors that result from this process are unintentional.

## 2016-11-26 NOTE — ED Notes (Signed)
Pt was dx with UTI last week per EMS.   EMS VS 99.1 oral, 98/56, CBG 123, NSR HR 70.

## 2016-11-26 NOTE — Consult Note (Signed)
ANTICOAGULATION CONSULT NOTE - Initial Consult  Pharmacy Consult for heparin drip Indication: NSEMI  Allergies  Allergen Reactions  . Penicillins     Has patient had a PCN reaction causing immediate rash, facial/tongue/throat swelling, SOB or lightheadedness with hypotension: Unknown Has patient had a PCN reaction causing severe rash involving mucus membranes or skin necrosis: Unknown Has patient had a PCN reaction that required hospitalization: No Has patient had a PCN reaction occurring within the last 10 years: No If all of the above answers are "NO", then may proceed with Cephalosporin use.   . Ciprofloxacin Rash    Patient Measurements: Weight: 122 lb (55.3 kg) Heparin Dosing Weight: 55.3kg  Vital Signs: Temp: 99 F (37.2 C) (07/17 1814) Temp Source: Oral (07/17 1814) BP: 158/48 (07/17 1830) Pulse Rate: 70 (07/17 1830)  Labs:  Recent Labs  11/26/16 1850  HGB 14.8  HCT 44.3  PLT 175  CREATININE 1.11*  CKTOTAL 2,108*  TROPONINI 4.78*    Estimated Creatinine Clearance: 27.3 mL/min (A) (by C-G formula based on SCr of 1.11 mg/dL (H)).   Medical History: Past Medical History:  Diagnosis Date  . Cancer ([place])   . Hypertension   . Kidney disease   . Renal insufficiency     Medications:  Scheduled:  . heparin  3,300 Units Intravenous Once    Assessment: Pt is a 83 year old female who presents after 2 falls. Unknown amount of time on the ground. Pt with an elevated troponin of 4.78. Pharmacy consulted to dose heparin drip for a NSEMI. Pt is not on any home anticoagulation. CBC WNL. APTT and INR added as add on  Goal of Therapy:  Heparin level 0.3-0.7 units/ml Monitor platelets by anticoagulation protocol: Yes   Plan:  Give 3300 units bolus x 1 Start heparin infusion at 650 units/hr Check anti-Xa level in 8 hours and daily while on heparin Continue to monitor H&H and platelets  Bradley Handyside D Moises Terpstra 11/26/2016,8:11 PM

## 2016-11-26 NOTE — ED Triage Notes (Signed)
Pt arrives to ED via [facility] from home. Pt was found down by daughter today around 4pm. Pt was found in floor. Believed to have fallen this AM. Daughter got pt back to bed and then pt fell again. Pt states she fell against mirror this morning. No obvious deformities noted, no bruising noted. Pt is shaky. Pt is alert to self, president, month, unsure of day. Pt states back pain and hips are sore. L hip tender to touch. Pt is able to move all extremities.

## 2016-11-27 ENCOUNTER — Inpatient Hospital Stay (HOSPITAL_COMMUNITY)
Admit: 2016-11-27 | Discharge: 2016-11-27 | Disposition: A | Discharge status: Home / Self Care | Payer: Medicare HMO | Attending: Internal Medicine | Admitting: Internal Medicine

## 2016-11-27 ENCOUNTER — Encounter: Payer: Self-pay | Admitting: Physician Assistant

## 2016-11-27 DIAGNOSIS — R748 Abnormal levels of other serum enzymes: Secondary | ICD-10-CM

## 2016-11-27 DIAGNOSIS — T796XXS Traumatic ischemia of muscle, sequela: Secondary | ICD-10-CM

## 2016-11-27 DIAGNOSIS — D72829 Elevated white blood cell count, unspecified: Secondary | ICD-10-CM

## 2016-11-27 DIAGNOSIS — I340 Nonrheumatic mitral (valve) insufficiency: Secondary | ICD-10-CM

## 2016-11-27 DIAGNOSIS — W19XXXA Unspecified fall, initial encounter: Secondary | ICD-10-CM

## 2016-11-27 DIAGNOSIS — G308 Other Alzheimer's disease: Secondary | ICD-10-CM

## 2016-11-27 DIAGNOSIS — N182 Chronic kidney disease, stage 2 (mild): Secondary | ICD-10-CM

## 2016-11-27 DIAGNOSIS — F0390 Unspecified dementia without behavioral disturbance: Secondary | ICD-10-CM

## 2016-11-27 DIAGNOSIS — F0280 Dementia in other diseases classified elsewhere without behavioral disturbance: Secondary | ICD-10-CM

## 2016-11-27 DIAGNOSIS — E876 Hypokalemia: Secondary | ICD-10-CM

## 2016-11-27 LAB — CBC
HEMATOCRIT: 43.3 % (ref 35.0–47.0)
Hemoglobin: 14.5 g/dL (ref 12.0–16.0)
MCH: 29 pg (ref 26.0–34.0)
MCHC: 33.5 g/dL (ref 32.0–36.0)
MCV: 86.6 fL (ref 80.0–100.0)
PLATELETS: 165 10*3/uL (ref 150–440)
RBC: 5 MIL/uL (ref 3.80–5.20)
RDW: 14.4 % (ref 11.5–14.5)
WBC: 8.8 10*3/uL (ref 3.6–11.0)

## 2016-11-27 LAB — TROPONIN I
TROPONIN I: 5.47 ng/mL — AB (ref <0.03)
Troponin I: 4.98 ng/mL (ref <0.03)

## 2016-11-27 LAB — ECHOCARDIOGRAM COMPLETE
HEIGHTINCHES: 60 in
WEIGHTICAEL: 2120 [oz_av]

## 2016-11-27 LAB — BASIC METABOLIC PANEL
Anion gap: 12 (ref 5–15)
BUN: 24 mg/dL — AB (ref 6–20)
CALCIUM: 9.3 mg/dL (ref 8.9–10.3)
CO2: 24 mmol/L (ref 22–32)
CREATININE: 1.1 mg/dL — AB (ref 0.44–1.00)
Chloride: 105 mmol/L (ref 101–111)
GFR calc Af Amer: 50 mL/min — ABNORMAL LOW (ref >60)
GFR, EST NON AFRICAN AMERICAN: 44 mL/min — AB (ref >60)
GLUCOSE: 104 mg/dL — AB (ref 65–99)
POTASSIUM: 2.6 mmol/L — AB (ref 3.5–5.1)
SODIUM: 141 mmol/L (ref 135–145)

## 2016-11-27 LAB — HEPARIN LEVEL (UNFRACTIONATED)
Heparin Unfractionated: 0.22 IU/mL — ABNORMAL LOW (ref 0.30–0.70)
Heparin Unfractionated: <0.1 IU/mL — ABNORMAL LOW (ref 0.30–0.70)
Heparin Unfractionated: 0.31 IU/mL (ref 0.30–0.70)

## 2016-11-27 LAB — CK: CK TOTAL: 2106 U/L — AB (ref 38–234)

## 2016-11-27 LAB — MAGNESIUM: MAGNESIUM: 1.7 mg/dL (ref 1.7–2.4)

## 2016-11-27 LAB — CKMB (ARMC ONLY): CK, MB: 20.4 ng/mL — ABNORMAL HIGH (ref 0.5–5.0)

## 2016-11-27 LAB — TSH: TSH: 1.258 u[IU]/mL (ref 0.350–4.500)

## 2016-11-27 MED ORDER — HYDROCODONE-ACETAMINOPHEN 5-325 MG PO TABS
1.0000 | ORAL_TABLET | Freq: Four times a day (QID) | ORAL | Status: DC | PRN
  Administered 2016-11-27 – 2016-11-28 (×3): 1 via ORAL
  Filled 2016-11-27 (×3): qty 1

## 2016-11-27 MED ORDER — FAMOTIDINE 20 MG PO TABS
20.0000 mg | ORAL_TABLET | Freq: Every day | ORAL | Status: DC
  Administered 2016-11-27 – 2016-12-03 (×7): 20 mg via ORAL
  Filled 2016-11-27 (×7): qty 1

## 2016-11-27 MED ORDER — DOCUSATE SODIUM 100 MG PO CAPS: 100.0000 mg | ORAL_CAPSULE | Freq: Two times a day (BID) | ORAL | Status: DC | PRN

## 2016-11-27 MED ORDER — POTASSIUM CHLORIDE CRYS ER 20 MEQ PO TBCR
20.0000 meq | EXTENDED_RELEASE_TABLET | Freq: Two times a day (BID) | ORAL | Status: DC
  Administered 2016-11-27 – 2016-12-03 (×12): 20 meq via ORAL
  Filled 2016-11-27 (×13): qty 1

## 2016-11-27 MED ORDER — MAGNESIUM SULFATE 4 GM/100ML IV SOLN
4.0000 g | Freq: Once | INTRAVENOUS | Status: AC
  Administered 2016-11-27: 4 g via INTRAVENOUS
  Filled 2016-11-27: qty 100

## 2016-11-27 MED ORDER — HALOPERIDOL LACTATE 5 MG/ML IJ SOLN
2.5000 mg | Freq: Four times a day (QID) | INTRAMUSCULAR | Status: DC | PRN
  Administered 2016-11-27: 2.5 mg via INTRAVENOUS
  Filled 2016-11-27: qty 1

## 2016-11-27 MED ORDER — HEPARIN BOLUS VIA INFUSION
900.0000 [IU] | Freq: Once | INTRAVENOUS | Status: AC
  Administered 2016-11-27: 900 [IU] via INTRAVENOUS
  Filled 2016-11-27: qty 900

## 2016-11-27 MED ORDER — ASPIRIN EC 81 MG PO TBEC
81.0000 mg | DELAYED_RELEASE_TABLET | Freq: Every day | ORAL | Status: DC
  Administered 2016-11-27 – 2016-12-03 (×7): 81 mg via ORAL
  Filled 2016-11-27 (×7): qty 1

## 2016-11-27 MED ORDER — FESOTERODINE FUMARATE ER 4 MG PO TB24
4.0000 mg | ORAL_TABLET | Freq: Every day | ORAL | Status: DC
  Administered 2016-11-27 – 2016-12-03 (×7): 4 mg via ORAL
  Filled 2016-11-27 (×7): qty 1

## 2016-11-27 MED ORDER — DONEPEZIL HCL 5 MG PO TABS
5.0000 mg | ORAL_TABLET | Freq: Every day | ORAL | Status: DC
  Administered 2016-11-27 – 2016-12-02 (×7): 5 mg via ORAL
  Filled 2016-11-27 (×8): qty 1

## 2016-11-27 MED ORDER — POTASSIUM CHLORIDE CRYS ER 20 MEQ PO TBCR
60.0000 meq | EXTENDED_RELEASE_TABLET | Freq: Once | ORAL | Status: AC
  Administered 2016-11-27: 60 meq via ORAL
  Filled 2016-11-27: qty 3

## 2016-11-27 MED ORDER — ESCITALOPRAM OXALATE 10 MG PO TABS
10.0000 mg | ORAL_TABLET | Freq: Every day | ORAL | Status: DC
  Administered 2016-11-27 – 2016-12-03 (×7): 10 mg via ORAL
  Filled 2016-11-27 (×7): qty 1

## 2016-11-27 MED ORDER — METOPROLOL SUCCINATE ER 50 MG PO TB24
50.0000 mg | ORAL_TABLET | Freq: Every day | ORAL | Status: DC
  Administered 2016-11-27 – 2016-12-03 (×7): 50 mg via ORAL
  Filled 2016-11-27 (×7): qty 1

## 2016-11-27 NOTE — Consult Note (Signed)
ANTICOAGULATION CONSULT NOTE - follow up [place] for heparin drip Indication: NSEMI  Allergies  Allergen Reactions  . Penicillins     Has patient had a PCN reaction causing immediate rash, facial/tongue/throat swelling, SOB or lightheadedness with hypotension: Unknown Has patient had a PCN reaction causing severe rash involving mucus membranes or skin necrosis: Unknown Has patient had a PCN reaction that required hospitalization: No Has patient had a PCN reaction occurring within the last 10 years: No If all of the above answers are "NO", then may proceed with Cephalosporin use.   . Ciprofloxacin Rash    Patient Measurements: Height: 5' (152.4 cm) Weight: 132 lb 8 oz (60.1 kg) IBW/kg (Calculated) : 45.5 Heparin Dosing Weight: 57.8  Vital Signs: Temp: 97.8 F (36.6 C) (07/18 1337) Temp Source: Oral (07/18 1337) BP: 133/60 (07/18 1337) Pulse Rate: 57 (07/18 1337)  Labs:  Recent Labs  11/26/16 1850 11/26/16 2100 11/27/16 0041 11/27/16 0436 11/27/16 1324  HGB 14.8  --   --  14.5  --   HCT 44.3  --   --  43.3  --   PLT 175  --   --  165  --   APTT 26  --   --   --   --   LABPROT 13.0  --   --   --   --   INR 0.98  --   --   --   --   HEPARINUNFRC  --   --   --  0.22* <0.10*  CREATININE 1.11*  --   --  1.10*  --   CKTOTAL 2,108*  --   --  2,106*  --   CKMB  --   --   --  20.4*  --   TROPONINI 4.78* 5.55* 5.47* 4.98*  --     Estimated Creatinine Clearance: 28.6 mL/min (A) (by C-G formula based on SCr of 1.1 mg/dL (H)).   Medical History: Past Medical History:  Diagnosis Date  . Breast cancer ([place])   . CKD (chronic kidney disease), stage II   . Dementia   . Frequent falls   . Hypertension     Medications:  Scheduled:  . aspirin EC  81 mg Oral Daily  . donepezil  5 mg Oral QHS  . escitalopram  10 mg Oral Daily  . famotidine  20 mg Oral Daily  . fesoterodine  4 mg Oral Daily  . metoprolol succinate  50 mg Oral Daily  . potassium  chloride  20 mEq Oral BID    Assessment: Pt is a 83 year old female who presents after 2 falls. Unknown amount of time on the ground. Pt with an elevated troponin of 4.78. Pharmacy consulted to dose heparin drip for a NSEMI. Pt is not on any home anticoagulation. CBC WNL. APTT and INR added as add on  Goal of Therapy:  Heparin level 0.3-0.7 units/ml Monitor platelets by anticoagulation protocol: Yes   Plan:  Give 3300 units bolus x 1 Start heparin infusion at 650 units/hr Check anti-Xa level in 8 hours and daily while on heparin Continue to monitor H&H and platelets   7/18 @ 0436 HL 0.22 subtherapeutic. Will rebolus w/ heparin 900 units IV x 1 and increase rate to 750 units/hr and will recheck HL @ 1300.  7/18 HL@ 1324= <0.10. Discussed with Casey Burkitt. RN Having trouble with IV pump that is running Heparin. Currently Heparin has been running consistently at 750 units/hr for about one hour.  Will need to recheck HL. Will recheck in 6 hours instead of 8 hrs. (Per cardiology- suspect elevated troponin is stress induced, not a primary ischemic event).  Chinita Greenland PharmD Clinical Pharmacist 11/27/2016

## 2016-11-27 NOTE — Progress Notes (Signed)
*  PRELIMINARY RESULTS* Echocardiogram 2D Echocardiogram has been performed.  Sherrie Sport 11/27/2016, 11:41 AM

## 2016-11-27 NOTE — Care Management (Signed)
Patient admitted for elevated troponin and rhabdomyolysis.  She fell in the home which she shares with her daughter and could not get up until daughter found her late afternoon.  She is DNR.  Cardiology is consulting

## 2016-11-27 NOTE — Progress Notes (Signed)
Kristin Brewer at [place] NAME: Kristin Brewer    MR#:  [id number]  DATE OF BIRTH:  [birth year]  SUBJECTIVE:   Patient here after a fall and noted to have acute rhabdomyolysis. Patient has also ruled in for a non-ST elevation MI. Denies any chest pain but is a very poor historian given her dementia.  REVIEW OF SYSTEMS:    Review of Systems  Unable to perform ROS: Dementia    Nutrition: Heart Healthy Tolerating Diet: Yes Tolerating PT: Await Eval.    DRUG ALLERGIES:   Allergies  Allergen Reactions  . Penicillins     Has patient had a PCN reaction causing immediate rash, facial/tongue/throat swelling, SOB or lightheadedness with hypotension: Unknown Has patient had a PCN reaction causing severe rash involving mucus membranes or skin necrosis: Unknown Has patient had a PCN reaction that required hospitalization: No Has patient had a PCN reaction occurring within the last 10 years: No If all of the above answers are "NO", then may proceed with Cephalosporin use.   . Ciprofloxacin Rash    VITALS:  Blood pressure 133/60, pulse (!) 57, temperature 97.8 F (36.6 C), temperature source Oral, resp. rate 14, height 5' (1.524 m), weight 60.1 kg (132 lb 8 oz), SpO2 96 %.  PHYSICAL EXAMINATION:   Physical Exam  GENERAL:  83 y.o.-year-old patient lying in bed in no acute distress.  EYES: Pupils equal, round, reactive to light and accommodation. No scleral icterus. Extraocular muscles intact.  HEENT: Head atraumatic, normocephalic. Oropharynx and nasopharynx clear.  NECK:  Supple, no jugular venous distention. No thyroid enlargement, no tenderness.  LUNGS: Normal breath sounds bilaterally, no wheezing, rales, rhonchi. No use of accessory muscles of respiration.  CARDIOVASCULAR: S1, S2 normal. No murmurs, rubs, or gallops.  ABDOMEN: Soft, nontender, nondistended. Bowel sounds present. No organomegaly or mass.  EXTREMITIES: No cyanosis, clubbing or  edema b/l.    NEUROLOGIC: Cranial nerves II through XII are intact. No focal Motor or sensory deficits b/l.  Globally weak.  PSYCHIATRIC: The patient is alert and oriented x 1.  SKIN: No obvious rash, lesion, or ulcer.    LABORATORY PANEL:   CBC  Recent Labs Lab 11/27/16 0436  WBC 8.8  HGB 14.5  HCT 43.3  PLT 165   ------------------------------------------------------------------------------------------------------------------  Chemistries   Recent Labs Lab 11/27/16 0436  NA 141  K 2.6*  CL 105  CO2 24  GLUCOSE 104*  BUN 24*  CREATININE 1.10*  CALCIUM 9.3  MG 1.7   ------------------------------------------------------------------------------------------------------------------  Cardiac Enzymes  Recent Labs Lab 11/27/16 0436  TROPONINI 4.98*   ------------------------------------------------------------------------------------------------------------------  RADIOLOGY:  Dg Chest 2 View  Result Date: 11/26/2016 CLINICAL DATA:  Back pain after fall EXAM: CHEST  2 VIEW COMPARISON:  CXR and thoracic spine radiograph report from 07/26/2016 FINDINGS: Heart is top-normal in size. There is aortic atherosclerosis without aneurysm. The lungs are clear without pneumothorax or pulmonary consolidations. Axillary clips are seen on the right. Degenerative disc disease consistent with spondylosis noted of the included lower cervical spine and upper thoracic spine. No acute appearing fracture nor bone destruction. IMPRESSION: 1. No active cardiopulmonary disease. 2. Degenerative changes are seen along the included lower cervical and upper thoracic spine. 3. Aortic atherosclerosis. Electronically Signed   By: Ashley Royalty M.D.   On: 11/26/2016 19:14   Dg Pelvis 1-2 Views  Result Date: 11/26/2016 CLINICAL DATA:  Patient found on floor around 4 p.m. Left hip tender to touch. EXAM: PELVIS -  1-2 VIEW COMPARISON:  CT 10/19/2015 FINDINGS: Lower lumbar degenerative disc disease from L4  through S1. The bony pelvis and hips appear intact without fracture or dislocations. No diastasis. Injection granulomas noted about the right hip, confirmed on prior CT. Bi-iliofemoral atherosclerotic calcifications are noted. IMPRESSION: Lower lumbar degenerative disc disease from L4 through S1. No acute fracture dislocations. Electronically Signed   By: Ashley Royalty M.D.   On: 11/26/2016 19:19   Ct Head Wo Contrast  Result Date: 11/26/2016 CLINICAL DATA:  Found on the floor today.  Some confusion. EXAM: CT HEAD WITHOUT CONTRAST TECHNIQUE: Contiguous axial images were obtained from the base of the skull through the vertex without intravenous contrast. COMPARISON:  None. FINDINGS: Brain: Generalized brain atrophy. Chronic small-vessel ischemic changes of the cerebral hemispheric white matter. No sign of acute infarction, mass lesion, hemorrhage, hydrocephalus or extra-axial collection. Vascular: There is atherosclerotic calcification of the major vessels at the base of the brain. Skull: Normal Sinuses/Orbits: Clear/ normal Other: None significant IMPRESSION: No acute finding. Age related atrophy and chronic small-vessel ischemic changes of the cerebral hemispheric white matter. Electronically Signed   By: Nelson Chimes M.D.   On: 11/26/2016 20:08   Dg Knee Complete 4 Views Right  Result Date: 11/26/2016 CLINICAL DATA:  Patient was found down around 4 p.m. today by daughter. The patient is believed to have fallen this morning. EXAM: RIGHT KNEE - COMPLETE 4+ VIEW COMPARISON:  None. FINDINGS: No evidence of fracture, dislocation, or joint effusion. Intact total knee arthroplasty with patellar resurfacing. Popliteal and tibial arteriosclerosis is identified. Small phleboliths are seen along the anterior aspect of the thigh. No evidence of arthropathy or other focal bone abnormality. No soft tissue mass or significant hematoma identified. IMPRESSION: 1. Intact right knee arthroplasty without posttraumatic fracture  nor dislocation. 2. No joint effusion. 3. No hardware failure. Electronically Signed   By: Ashley Royalty M.D.   On: 11/26/2016 19:17     ASSESSMENT AND PLAN:   83 year old female with past medical history of dementia, chronic kidney disease stage II, hypertension, history of breast cancer who presented to the hospital after she was found down by her daughter and noted to have acute rhabdomyolysis. Patient also ruled in for a non-ST elevation MI.  1. Non-ST elevation MI-patient ruled in by cardiac markers as her troponin trended upwards as high as 6. She is clinically asymptomatic although. -Seen by cardiology, continue aspirin, heparin drip, Toprol. -Await echocardiogram results and further plan for treatment based on echo results.  2. Acute rhabdomyolysis-secondary to her fall. Continue IV fluids, follow serial CKs.  3. History of recurrent falls-await physical therapy evaluation patient likely will need short-term rehabilitation.  4. Hypokalemia-we'll continue to replace and repeat level in the morning. -Patient also noted to be hypomagnesemic will replace magnesium accordingly.  5. Dementia-continue Aricept.  6. Depression-continue Lexapro.  7. History of urinary incontinence-continue Toviaz.  Discussed plan of care with Daughter at bedside.   All the records are reviewed and case discussed with Care Management/Social Worker. Management plans discussed with the patient, family and they are in agreement.  CODE STATUS: DNR  DVT Prophylaxis: Hep. gtt  TOTAL TIME TAKING CARE OF THIS PATIENT: 30 minutes.   POSSIBLE D/C IN 1-2 DAYS, DEPENDING ON CLINICAL CONDITION.   Henreitta Leber M.D on 11/27/2016 at 3:07 PM  Between 7am to 6pm - Pager - [phone]  After 6pm go to [email or web address] - [facility]  Office  [phone]  CC:  Primary care physician; Idelle Crouch, MD

## 2016-11-27 NOTE — Consult Note (Signed)
ANTICOAGULATION CONSULT NOTE - Initial Consult  Pharmacy Consult for heparin drip Indication: NSEMI  Allergies  Allergen Reactions  . Penicillins     Has patient had a PCN reaction causing immediate rash, facial/tongue/throat swelling, SOB or lightheadedness with hypotension: Unknown Has patient had a PCN reaction causing severe rash involving mucus membranes or skin necrosis: Unknown Has patient had a PCN reaction that required hospitalization: No Has patient had a PCN reaction occurring within the last 10 years: No If all of the above answers are "NO", then may proceed with Cephalosporin use.   . Ciprofloxacin Rash    Patient Measurements: Height: 5' (152.4 cm) Weight: 132 lb 8 oz (60.1 kg) IBW/kg (Calculated) : 45.5 Heparin Dosing Weight: 55.3kg  Vital Signs: Temp: 98.2 F (36.8 C) (07/18 0503) Temp Source: Oral (07/18 0503) BP: 138/55 (07/18 0503) Pulse Rate: 58 (07/18 0503)  Labs:  Recent Labs  11/26/16 1850 11/26/16 2100 11/27/16 0041 11/27/16 0436  HGB 14.8  --   --  14.5  HCT 44.3  --   --  43.3  PLT 175  --   --  165  APTT 26  --   --   --   LABPROT 13.0  --   --   --   INR 0.98  --   --   --   HEPARINUNFRC  --   --   --  0.22*  CREATININE 1.11*  --   --   --   CKTOTAL 2,108*  --   --   --   TROPONINI 4.78* 5.55* 5.47* 4.98*    Estimated Creatinine Clearance: 28.4 mL/min (A) (by C-G formula based on SCr of 1.11 mg/dL (H)).   Medical History: Past Medical History:  Diagnosis Date  . Cancer ([place])   . Hypertension   . Kidney disease   . Renal insufficiency     Medications:  Scheduled:  . aspirin EC  81 mg Oral Daily  . donepezil  5 mg Oral QHS  . escitalopram  10 mg Oral Daily  . famotidine  20 mg Oral Daily  . fesoterodine  4 mg Oral Daily  . heparin  900 Units Intravenous Once  . metoprolol succinate  50 mg Oral Daily    Assessment: Pt is a 83 year old female who presents after 2 falls. Unknown amount of time on the ground. Pt with an  elevated troponin of 4.78. Pharmacy consulted to dose heparin drip for a NSEMI. Pt is not on any home anticoagulation. CBC WNL. APTT and INR added as add on  Goal of Therapy:  Heparin level 0.3-0.7 units/ml Monitor platelets by anticoagulation protocol: Yes   Plan:  Give 3300 units bolus x 1 Start heparin infusion at 650 units/hr Check anti-Xa level in 8 hours and daily while on heparin Continue to monitor H&H and platelets   7/18 @ 0436 HL 0.22 subtherapeutic. Will rebolus w/ heparin 900 units IV x 1 and increase rate to 750 units/hr and will recheck HL @ 1300.  Tobie Lords, PharmD, BCPS Clinical Pharmacist 11/27/2016

## 2016-11-27 NOTE — Consult Note (Signed)
ANTICOAGULATION CONSULT NOTE - follow up [facility] for heparin drip Indication: NSEMI  Allergies  Allergen Reactions  . Penicillins     Has patient had a PCN reaction causing immediate rash, facial/tongue/throat swelling, SOB or lightheadedness with hypotension: Unknown Has patient had a PCN reaction causing severe rash involving mucus membranes or skin necrosis: Unknown Has patient had a PCN reaction that required hospitalization: No Has patient had a PCN reaction occurring within the last 10 years: No If all of the above answers are "NO", then may proceed with Cephalosporin use.   . Ciprofloxacin Rash    Patient Measurements: Height: 5' (152.4 cm) Weight: 132 lb 8 oz (60.1 kg) IBW/kg (Calculated) : 45.5 Heparin Dosing Weight: 57.8  Vital Signs: Temp: 97.8 F (36.6 C) (07/18 1337) Temp Source: Oral (07/18 1337) BP: 133/60 (07/18 1337) Pulse Rate: 57 (07/18 1337)  Labs:  Recent Labs  11/26/16 1850 11/26/16 2100 11/27/16 0041 11/27/16 0436 11/27/16 1324 11/27/16 1905  HGB 14.8  --   --  14.5  --   --   HCT 44.3  --   --  43.3  --   --   PLT 175  --   --  165  --   --   APTT 26  --   --   --   --   --   LABPROT 13.0  --   --   --   --   --   INR 0.98  --   --   --   --   --   HEPARINUNFRC  --   --   --  0.22* <0.10* 0.31  CREATININE 1.11*  --   --  1.10*  --   --   CKTOTAL 2,108*  --   --  2,106*  --   --   CKMB  --   --   --  20.4*  --   --   TROPONINI 4.78* 5.55* 5.47* 4.98*  --   --     Estimated Creatinine Clearance: 28.6 mL/min (A) (by C-G formula based on SCr of 1.1 mg/dL (H)).   Medical History: Past Medical History:  Diagnosis Date  . Breast cancer ([place])   . CKD (chronic kidney disease), stage II   . Dementia   . Frequent falls   . Hypertension     Medications:  Scheduled:  . aspirin EC  81 mg Oral Daily  . donepezil  5 mg Oral QHS  . escitalopram  10 mg Oral Daily  . famotidine  20 mg Oral Daily  . fesoterodine  4 mg Oral  Daily  . metoprolol succinate  50 mg Oral Daily  . potassium chloride  20 mEq Oral BID    Assessment: Pt is a 83 year old female who presents after 2 falls. Unknown amount of time on the ground. Pt with an elevated troponin of 4.78. Pharmacy consulted to dose heparin drip for a NSEMI. Pt is not on any home anticoagulation. CBC WNL. APTT and INR added as add on  Goal of Therapy:  Heparin level 0.3-0.7 units/ml Monitor platelets by anticoagulation protocol: Yes   Plan:  HL therapeutic at 0.31 Per cardiology- suspect elevated troponin is stress induced, not a primary ischemic event. Continue heparin drip at current rate. Recheck in 8 hours.  Ramond Dial, Pharm.D, BCPS Clinical Pharmacist  11/27/2016

## 2016-11-27 NOTE — Consult Note (Signed)
Cardiology Consultation Note  Patient ID: Kristin Brewer, MRN: [id number], DOB/AGE: Jul 09, 1926 83 y.o. Admit date: 11/26/2016   Date of Consult: 11/27/2016 Primary Physician: Idelle Crouch, MD Primary Cardiologist: New to [facility] - consult by Rockey Situ Requesting Physician: Dr. Anselm Jungling, MD  Chief Complaint: Mechanical fall Reason for Consult: Elevated troponin  HPI: Kristin Brewer is a 83 y.o. female who is being seen today for the evaluation of elevated troponin at the request of Dr. Marthann Schiller, MD. Patient has a h/o breast cancer, dementia, CKD stage II, frequent falls, and HTN who presented to [facility] on 7/17 after suffering 2 mechanical falls.   No prior known cardiac history. She was in her usual state of health on 7/17 when she got up to go use the restroom. She was home alone and suffered a mechanical fall. She was not able to get up off the floor on her own and laid on the floor until her daughter got home from work and was able to get the patient to the bed. This was followed by another mechanical fall. Her daughter was concerned, thus bringing her to the hospital. Patient denies ever having any chest pain or SOB prior to the above. No palpitations. Patient denies hitting her head or suffering LOC.   Upon the patient's arrival to [facility] they were found to have BP 137/71, HR 71 bpm, temp 99, oxygen saturation 94% on room air, weight 122 pounds. EKG as below, CXR showed not acute. Head CT not acute. Low back and knee films not acute. Labs showed CK 2,108-->2,106, troponin 4.78-->5.55-->4.98. SCr 1.11-->1.10 (basleine ` 1.1-1.2), K+ 3.0-->2.6. She has been started on IV fluids and a heparin gtt by IM. Currently, asymptomatic.   Past Medical History:  Diagnosis Date  . Breast cancer ([place])   . CKD (chronic kidney disease), stage II   . Dementia   . Frequent falls   . Hypertension       Most Recent Cardiac Studies: none   Surgical History:  Past Surgical History:  Procedure Laterality Date  .  KNEE SURGERY       Home Meds: Prior to Admission medications   Medication Sig Start Date End Date Taking? Authorizing Provider  amLODipine (NORVASC) 5 MG tablet Take 5 mg by mouth daily.  07/21/15  Yes [provider]  aspirin 81 MG EC tablet Take 81 mg by mouth daily. 03/31/16  Yes [provider]  CRANBERRY PO Take 2 tablets by mouth daily.   Yes [provider]  donepezil (ARICEPT) 5 MG tablet Take 5 mg by mouth at bedtime.  02/12/16 02/11/17 Yes [provider]  escitalopram (LEXAPRO) 10 MG tablet Take 10 mg by mouth daily.   Yes [provider]  HYDROcodone-acetaminophen (NORCO/VICODIN) 5-325 MG tablet Take 1 tablet by mouth every 6 (six) hours as needed.  05/08/16  Yes [provider]  indapamide (LOZOL) 2.5 MG tablet TAKE 1 TABLET (2.5 MG TOTAL) BY MOUTH ONCE DAILY. 03/31/16  Yes [provider]  KLOR-CON 10 10 MEQ tablet Take 20 mEq by mouth daily.  03/31/16  Yes [provider]  LORazepam (ATIVAN) 1 MG tablet TAKE 1 TABLET BY MOUTH AT BEDTIME AS NEEDED 03/23/16  Yes [provider]  magnesium oxide (MAG-OX) 400 (241.3 Mg) MG tablet Take 1 tablet by mouth daily. 03/25/16  Yes [provider]  metoprolol succinate (TOPROL-XL) 100 MG 24 hr tablet TAKE 1 TABLET (100 MG TOTAL) BY MOUTH ONCE DAILY. 05/31/16  Yes [provider]  ranitidine (ZANTAC) 150 MG tablet TAKE 1 TABLET (150 MG TOTAL) BY MOUTH 2 (TWO) TIMES DAILY. 03/31/16  Yes [provider]  tolterodine (DETROL LA) 4 MG 24 hr capsule Take 4 mg by mouth daily. 05/31/16  Yes [provider]  Vitamin D, Ergocalciferol, (DRISDOL) 50000 units CAPS capsule TAKE 1 CAPSULE BY MOUTH EVERY MONTH 06/01/16  Yes [provider]    Inpatient Medications:  . aspirin EC  81 mg Oral Daily  . donepezil  5 mg Oral QHS  . escitalopram  10 mg Oral Daily  . famotidine  20 mg Oral Daily  . fesoterodine  4 mg Oral Daily  .  metoprolol succinate  50 mg Oral Daily  . potassium chloride  20 mEq Oral BID  . potassium chloride  60 mEq Oral Once   . sodium chloride 100 mL/hr at 11/27/16 0053  . heparin 750 Units/hr (11/27/16 0700)    Allergies:  Allergies  Allergen Reactions  . Penicillins     Has patient had a PCN reaction causing immediate rash, facial/tongue/throat swelling, SOB or lightheadedness with hypotension: Unknown Has patient had a PCN reaction causing severe rash involving mucus membranes or skin necrosis: Unknown Has patient had a PCN reaction that required hospitalization: No Has patient had a PCN reaction occurring within the last 10 years: No If all of the above answers are "NO", then may proceed with Cephalosporin use.   . Ciprofloxacin Rash    Social History   Social History  . Marital status: Widowed    Spouse name: N/A  . Number of children: N/A  . Years of education: N/A   Occupational History  . Not on file.   Social History Main Topics  . Smoking status: Never Smoker  . Smokeless tobacco: Never Used  . Alcohol use Yes  . Drug use: Unknown  . Sexual activity: Not on file   Other Topics Concern  . Not on file   Social History Narrative  . No narrative on file     Family History  Problem Relation Age of Onset  . CAD Mother   . CAD Father      Review of Systems: Review of Systems  Constitutional: Positive for malaise/fatigue. Negative for chills, diaphoresis, fever and weight loss.  HENT: Negative for congestion.   Eyes: Negative for discharge and redness.  Respiratory: Negative for cough, hemoptysis, sputum production, shortness of breath and wheezing.   Cardiovascular: Negative for chest pain, palpitations, orthopnea, claudication, leg swelling and PND.  Gastrointestinal: Negative for abdominal pain, blood in stool, heartburn, melena, nausea and vomiting.  Genitourinary: Negative for hematuria.  Musculoskeletal: Positive for back pain, falls, joint pain and  myalgias.  Skin: Negative for rash.  Neurological: Positive for weakness. Negative for dizziness, tingling, tremors, sensory change, speech change, focal weakness and loss of consciousness.  Endo/Heme/Allergies: Does not bruise/bleed easily.  Psychiatric/Behavioral: Negative for substance abuse. The patient is not nervous/anxious.   All other systems reviewed and are negative.   Labs:  Recent Labs  11/26/16 1850 11/26/16 2100 11/27/16 0041 11/27/16 0436  CKTOTAL 2,108*  --   --  2,106*  TROPONINI 4.78* 5.55* 5.47* 4.98*   Lab Results  Component Value Date   WBC 8.8 11/27/2016   HGB 14.5 11/27/2016   HCT 43.3 11/27/2016   MCV 86.6 11/27/2016   PLT 165 11/27/2016     Recent Labs Lab 11/27/16 0436  NA 141  K 2.6*  CL 105  CO2 24  BUN 24*  CREATININE 1.10*  CALCIUM 9.3  GLUCOSE 104*   Lab Results  Component Value Date   CHOL 296 (H) 11/26/2016   HDL 86 11/26/2016   LDLCALC 187 (H) 11/26/2016   TRIG 116 11/26/2016   No results found for: DDIMER  Radiology/Studies:  Dg Chest 2 View  Result Date: 11/26/2016 IMPRESSION: 1. No active cardiopulmonary disease. 2. Degenerative changes are seen along the included lower cervical and upper thoracic spine. 3. Aortic atherosclerosis. Electronically Signed   By: Ashley Royalty M.D.   On: 11/26/2016 19:14   Dg Pelvis 1-2 Views  Result Date: 11/26/2016 IMPRESSION: Lower lumbar degenerative disc disease from L4 through S1. No acute fracture dislocations. Electronically Signed   By: Ashley Royalty M.D.   On: 11/26/2016 19:19   Ct Head Wo Contrast  Result Date: 11/26/2016 IMPRESSION: No acute finding. Age related atrophy and chronic small-vessel ischemic changes of the cerebral hemispheric white matter. Electronically Signed   By: Nelson Chimes M.D.   On: 11/26/2016 20:08   Dg Knee Complete 4 Views Right  Result Date: 11/26/2016 IMPRESSION: 1. Intact right knee arthroplasty without posttraumatic fracture nor dislocation. 2. No joint  effusion. 3. No hardware failure. Electronically Signed   By: Ashley Royalty M.D.   On: 11/26/2016 19:17    EKG: Interpreted by me showed: NSR, 66 bpm, mild inferior ST elevation (not m eeting requirements for STEMI), lateral st depression/TWI Telemetry: Interpreted by me showed: NSR  Weights: Filed Weights   11/26/16 2005 11/27/16 0013  Weight: 122 lb (55.3 kg) 132 lb 8 oz (60.1 kg)     Physical Exam: Blood pressure (!) 138/55, pulse (!) 58, temperature 98.2 F (36.8 C), temperature source Oral, resp. rate 18, height 5' (1.524 m), weight 132 lb 8 oz (60.1 kg), SpO2 96 %. Body mass index is 25.88 kg/m. General: Frail appearing, in no acute distress. Head: Normocephalic, atraumatic, sclera non-icteric, no xanthomas, nares are without discharge.  Neck: Negative for carotid bruits. JVD not elevated. Lungs: Clear bilaterally to auscultation without wheezes, rales, or rhonchi. Breathing is unlabored. Heart: RRR with S1 S2. No murmurs, rubs, or gallops appreciated. Abdomen: Soft, non-tender, non-distended with normoactive bowel sounds. No hepatomegaly. No rebound/guarding. No obvious abdominal masses. Msk:  Strength and tone appear normal for age. Extremities: No clubbing or cyanosis. No edema. Distal pedal pulses are 2+ and equal bilaterally. Neuro: Alert. No facial asymmetry. No focal deficit. Moves all extremities spontaneously. Psych:  Responds to questions appropriately with a normal affect.    Assessment and Plan:  Principal Problem:   Rhabdomyolysis Active Problems:   Fall   Dementia   Hypokalemia   Elevated troponin   CKD (chronic kidney disease), stage II   Leukocytosis    1. Elevated troponin: -Patient never with chest pain or SOB -Possibly elevated in the setting of rhabdomyolysis  -Troponin peaked at 5.55, now down trending -Heparin gtt for now given bump in troponin  -TTE pending -Await TTE results to dictate possible ischemic evaluation prior to discharge via  nuclear stress testing vs LHC -Add CKMB -ASA -Toprol  2. Rhabdomyolysis: -IV fluids to IM -Trend CK  3. CKD stage II: -Improving with IV fluids  4. Hypokalemia: -Replete to goal of at least 4.0 -Check magnesium with recommendation to replete to at least 2.0  5. Leukocytosis: -Resolved  6. Frequent falls: -Recommend PT evaluation  7. Dementia: -Per IM   Signed, Christell Faith, PA-C [facility] Pager: [phone] 11/27/2016, 7:51 AM

## 2016-11-28 DIAGNOSIS — I248 Other forms of acute ischemic heart disease: Secondary | ICD-10-CM

## 2016-11-28 LAB — CBC
HEMATOCRIT: 38.6 % (ref 35.0–47.0)
HEMOGLOBIN: 13.1 g/dL (ref 12.0–16.0)
MCH: 29.5 pg (ref 26.0–34.0)
MCHC: 34.1 g/dL (ref 32.0–36.0)
MCV: 86.6 fL (ref 80.0–100.0)
Platelets: 153 10*3/uL (ref 150–440)
RBC: 4.46 MIL/uL (ref 3.80–5.20)
RDW: 14.5 % (ref 11.5–14.5)
WBC: 7.2 10*3/uL (ref 3.6–11.0)

## 2016-11-28 LAB — CK: Total CK: 1126 U/L — ABNORMAL HIGH (ref 38–234)

## 2016-11-28 LAB — MAGNESIUM: Magnesium: 2.2 mg/dL (ref 1.7–2.4)

## 2016-11-28 LAB — BASIC METABOLIC PANEL
Anion gap: 6 (ref 5–15)
BUN: 22 mg/dL — ABNORMAL HIGH (ref 6–20)
CHLORIDE: 113 mmol/L — AB (ref 101–111)
CO2: 22 mmol/L (ref 22–32)
CREATININE: 1.02 mg/dL — AB (ref 0.44–1.00)
Calcium: 8.8 mg/dL — ABNORMAL LOW (ref 8.9–10.3)
GFR calc non Af Amer: 48 mL/min — ABNORMAL LOW (ref >60)
GFR, EST AFRICAN AMERICAN: 55 mL/min — AB (ref >60)
Glucose, Bld: 108 mg/dL — ABNORMAL HIGH (ref 65–99)
Potassium: 3.5 mmol/L (ref 3.5–5.1)
Sodium: 141 mmol/L (ref 135–145)

## 2016-11-28 LAB — HEPARIN LEVEL (UNFRACTIONATED): HEPARIN UNFRACTIONATED: 0.56 [IU]/mL (ref 0.30–0.70)

## 2016-11-28 LAB — HEMOGLOBIN A1C
HEMOGLOBIN A1C: 5.6 % (ref 4.8–5.6)
MEAN PLASMA GLUCOSE: 114 mg/dL

## 2016-11-28 MED ORDER — SODIUM CHLORIDE 0.9 % IV SOLN
INTRAVENOUS | Status: DC
  Administered 2016-11-28 – 2016-11-29 (×2): via INTRAVENOUS

## 2016-11-28 MED ORDER — ACETAMINOPHEN 325 MG PO TABS
650.0000 mg | ORAL_TABLET | Freq: Four times a day (QID) | ORAL | Status: DC | PRN
  Administered 2016-11-28 – 2016-12-02 (×7): 650 mg via ORAL
  Filled 2016-11-28 (×8): qty 2

## 2016-11-28 NOTE — Progress Notes (Signed)
Patient presently resting in the bed, alert and oriented, denies any pain, family at bedside, mood calm patient waiting for PT /ot eval  As per order.

## 2016-11-28 NOTE — Consult Note (Signed)
ANTICOAGULATION CONSULT NOTE - follow up [facility] for heparin drip Indication: NSEMI  Allergies  Allergen Reactions  . Penicillins     Has patient had a PCN reaction causing immediate rash, facial/tongue/throat swelling, SOB or lightheadedness with hypotension: Unknown Has patient had a PCN reaction causing severe rash involving mucus membranes or skin necrosis: Unknown Has patient had a PCN reaction that required hospitalization: No Has patient had a PCN reaction occurring within the last 10 years: No If all of the above answers are "NO", then may proceed with Cephalosporin use.   . Ciprofloxacin Rash    Patient Measurements: Height: 5' (152.4 cm) Weight: 132 lb 8 oz (60.1 kg) IBW/kg (Calculated) : 45.5 Heparin Dosing Weight: 57.8  Vital Signs: Temp: 97.8 F (36.6 C) (07/19 0411) Temp Source: Oral (07/19 0411) BP: 156/36 (07/19 0411) Pulse Rate: 49 (07/19 0411)  Labs:  Recent Labs  11/26/16 1850 11/26/16 2100 11/27/16 0041  11/27/16 0436 11/27/16 1324 11/27/16 1905 11/28/16 0603  HGB 14.8  --   --   --  14.5  --   --  13.1  HCT 44.3  --   --   --  43.3  --   --  38.6  PLT 175  --   --   --  165  --   --  153  APTT 26  --   --   --   --   --   --   --   LABPROT 13.0  --   --   --   --   --   --   --   INR 0.98  --   --   --   --   --   --   --   HEPARINUNFRC  --   --   --   < > 0.22* <0.10* 0.31 0.56  CREATININE 1.11*  --   --   --  1.10*  --   --  1.02*  CKTOTAL 2,108*  --   --   --  2,106*  --   --  1,126*  CKMB  --   --   --   --  20.4*  --   --   --   TROPONINI 4.78* 5.55* 5.47*  --  4.98*  --   --   --   < > = values in this interval not displayed.  Estimated Creatinine Clearance: 30.9 mL/min (A) (by C-G formula based on SCr of 1.02 mg/dL (H)).   Medical History: Past Medical History:  Diagnosis Date  . Breast cancer ([place])   . CKD (chronic kidney disease), stage II   . Dementia   . Frequent falls   . Hypertension      Medications:  Scheduled:  . aspirin EC  81 mg Oral Daily  . donepezil  5 mg Oral QHS  . escitalopram  10 mg Oral Daily  . famotidine  20 mg Oral Daily  . fesoterodine  4 mg Oral Daily  . metoprolol succinate  50 mg Oral Daily  . potassium chloride  20 mEq Oral BID    Assessment: Pt is a 83 year old female who presents after 2 falls. Unknown amount of time on the ground. Pt with an elevated troponin of 4.78. Pharmacy consulted to dose heparin drip for a NSEMI. Pt is not on any home anticoagulation. CBC WNL. APTT and INR added as add on  Goal of Therapy:  Heparin level 0.3-0.7 units/ml Monitor platelets  by anticoagulation protocol: Yes   Plan:  HL therapeutic at 0.31 Per cardiology- suspect elevated troponin is stress induced, not a primary ischemic event. Continue heparin drip at current rate. Recheck in 8 hours.  7/19 @ 0600 HL 0.56 therapeutic. Will continue current rate and will recheck HL w/ am labs.  Tobie Lords, PharmD, BCPS Clinical Pharmacist 11/28/2016

## 2016-11-28 NOTE — Clinical Social Work Note (Signed)
CSW received message to contact patient's daughter Kristin Brewer [phone] in regards to patient possibly needing SNF for short term rehab.  CSW spoke to patient's daughter, she was unable to talk and will call CSW back at a later time.  CSW awaiting PT and OT recommendations.  CSW to continue to follow patient's progress throughout discharge planning.  Jones Broom. [place], MSW, [place]  11/28/2016 5:18 PM

## 2016-11-28 NOTE — Progress Notes (Signed)
Chaplain visited patient on rounds. Provided words of encouragement and ministry of prayer, as requested by patient.     11/28/16 1600  Clinical Encounter Type  Visited With Patient  Visit Type Initial;Spiritual support  Referral From Chaplain  Consult/Referral To Chaplain  Spiritual Encounters  Spiritual Needs Prayer

## 2016-11-28 NOTE — Progress Notes (Signed)
Patient Name: Kristin Brewer Date of Encounter: 11/28/2016  Primary Cardiologist: New to [facility] - consult by [facility] Problem List     Principal Problem:   Rhabdomyolysis Active Problems:   Fall   Dementia   Hypokalemia   Elevated troponin   CKD (chronic kidney disease), stage II   Leukocytosis     Subjective   Remains confused. TTE with normal EF and no RWMA. No family present. CK improving, now down to 1126. CKMB 20.4.   Inpatient Medications    Scheduled Meds: . aspirin EC  81 mg Oral Daily  . donepezil  5 mg Oral QHS  . escitalopram  10 mg Oral Daily  . famotidine  20 mg Oral Daily  . fesoterodine  4 mg Oral Daily  . metoprolol succinate  50 mg Oral Daily  . potassium chloride  20 mEq Oral BID   Continuous Infusions: . heparin 750 Units/hr (11/27/16 0700)   PRN Meds: docusate sodium, haloperidol lactate, HYDROcodone-acetaminophen   Vital Signs    Vitals:   11/27/16 0918 11/27/16 1337 11/27/16 2024 11/28/16 0411  BP:  133/60 103/78 (!) 156/36  Pulse: (!) 104 (!) 57 90 (!) 49  Resp:  14 18 18   Temp:  97.8 F (36.6 C) 98.4 F (36.9 C) 97.8 F (36.6 C)  TempSrc:  Oral Oral Oral  SpO2:  96% 97% 95%  Weight:      Height:        Intake/Output Summary (Last 24 hours) at 11/28/16 0752 Last data filed at 11/28/16 0300  Gross per 24 hour  Intake                0 ml  Output              710 ml  Net             -710 ml   Filed Weights   11/26/16 2005 11/27/16 0013  Weight: 122 lb (55.3 kg) 132 lb 8 oz (60.1 kg)    Physical Exam    GEN: Frail appearing, in no acute distress.  HEENT: Grossly normal.  Neck: Supple, no JVD, carotid bruits, or masses. Cardiac: RRR, no murmurs, rubs, or gallops. No clubbing, cyanosis, edema.  Radials/DP/PT 2+ and equal bilaterally.  Respiratory:  Respirations regular and unlabored, clear to auscultation bilaterally. GI: Soft, nontender, nondistended, BS + x 4. MS: no deformity or atrophy. Skin: warm and dry, no  rash. Neuro:  Strength and sensation are intact. Psych: Alert.  Normal affect.  Labs    CBC  Recent Labs  11/27/16 0436 11/28/16 0603  WBC 8.8 7.2  HGB 14.5 13.1  HCT 43.3 38.6  MCV 86.6 86.6  PLT 165 419   Basic Metabolic Panel  Recent Labs  11/27/16 0436 11/28/16 0603  NA 141 141  K 2.6* 3.5  CL 105 113*  CO2 24 22  GLUCOSE 104* 108*  BUN 24* 22*  CREATININE 1.10* 1.02*  CALCIUM 9.3 8.8*  MG 1.7  --    Liver Function Tests No results for input(s): AST, ALT, ALKPHOS, BILITOT, PROT, ALBUMIN in the last 72 hours. No results for input(s): LIPASE, AMYLASE in the last 72 hours. Cardiac Enzymes  Recent Labs  11/26/16 1850 11/26/16 2100 11/27/16 0041 11/27/16 0436 11/28/16 0603  CKTOTAL 2,108*  --   --  2,106* 1,126*  CKMB  --   --   --  20.4*  --   TROPONINI 4.78* 5.55* 5.47* 4.98*  --  BNP Invalid input(s): POCBNP D-Dimer No results for input(s): DDIMER in the last 72 hours. Hemoglobin A1C  Recent Labs  11/26/16 2100  HGBA1C 5.6   Fasting Lipid Panel  Recent Labs  11/26/16 2100  CHOL 296*  HDL 86  LDLCALC 187*  TRIG 116  CHOLHDL 3.4   Thyroid Function Tests  Recent Labs  11/27/16 0436  TSH 1.258    Telemetry    Sinus bradycardia, 50s bpm - Personally Reviewed  ECG    n/a - Personally Reviewed  Radiology    Dg Chest 2 View  Result Date: 11/26/2016 IMPRESSION: 1. No active cardiopulmonary disease. 2. Degenerative changes are seen along the included lower cervical and upper thoracic spine. 3. Aortic atherosclerosis. Electronically Signed   By: Ashley Royalty M.D.   On: 11/26/2016 19:14   Dg Pelvis 1-2 Views  Result Date: 11/26/2016 IMPRESSION: Lower lumbar degenerative disc disease from L4 through S1. No acute fracture dislocations. Electronically Signed   By: Ashley Royalty M.D.   On: 11/26/2016 19:19   Ct Head Wo Contrast  Result Date: 11/26/2016 IMPRESSION: No acute finding. Age related atrophy and chronic small-vessel  ischemic changes of the cerebral hemispheric white matter. Electronically Signed   By: Nelson Chimes M.D.   On: 11/26/2016 20:08   Dg Knee Complete 4 Views Right  Result Date: 11/26/2016 IMPRESSION: 1. Intact right knee arthroplasty without posttraumatic fracture nor dislocation. 2. No joint effusion. 3. No hardware failure. Electronically Signed   By: Ashley Royalty M.D.   On: 11/26/2016 19:17    Cardiac Studies   TTE 11/27/2016: Study Conclusions  - Left ventricle: The cavity size was normal. There was mild   concentric hypertrophy. Systolic function was normal. The   estimated ejection fraction was in the range of 60% to 65%. Wall   motion was normal; there were no regional wall motion   abnormalities. Doppler parameters are consistent with abnormal   left ventricular relaxation (grade 1 diastolic dysfunction). - Mitral valve: Calcified annulus. There was mild regurgitation. - Left atrium: The atrium was normal in size. - Right ventricle: Systolic function was normal. - Pulmonary arteries: Systolic pressure was within the normal   range.  Patient Profile     83 y.o. female with history of breast cancer, dementia, CKD stage II, frequent falls, and HTN who presented to [facility] on 7/17 after suffering 2 mechanical falls.   Assessment & Plan    1. Elevated troponin: -Patient never with chest pain or SOB -Possibly elevated in the setting of rhabdomyolysis  -Troponin peaked at 5.55, now down trending -Heparin gtt for now given bump in troponin (stop ~ 2000 on 7/19) -TTE with normal EF and wall motion -Unlikely to be a good LHC candidate given her dementia and advanced age -Consider Lexiscan Myoview to evaluate for high-risk ischemia (will need to talk with family as patient cannot sign consent given her demntia) -ASA -Toprol  2. Rhabdomyolysis: -IV fluids to IM -Trend CK  3. CKD stage II: -Improving with IV fluids  4. Hypokalemia: -Replete to goal of at least 4.0 -Check  magnesium with recommendation to replete to at least 2.0  5. Leukocytosis: -Resolved  6. Frequent falls: -Recommend PT evaluation  7. Dementia: -Will need to discuss with family regarding what type of evaluation they would like -Per IM  Signed, Christell Faith, PA-C [place] Pager: [phone] 11/28/2016, 7:52 AM

## 2016-11-28 NOTE — Progress Notes (Signed)
[place] at [place] NAME: Kristin Brewer    MR#:  [id number]  DATE OF BIRTH:  [birth year]  SUBJECTIVE:   Patient here after a fall and noted to have acute rhabdomyolysis. Patient has also ruled in for a non-ST elevation MI. Echocardiogram showing normal ejection fraction with no wall motion abnormalities. No chest pain or acute events overnight. As per cardiology with DC heparin drip. Await physical therapy evaluation. CKs trending down.  REVIEW OF SYSTEMS:    Review of Systems  Unable to perform ROS: Dementia    Nutrition: Heart Healthy Tolerating Diet: Yes Tolerating PT: Await Eval.    DRUG ALLERGIES:   Allergies  Allergen Reactions  . Penicillins     Has patient had a PCN reaction causing immediate rash, facial/tongue/throat swelling, SOB or lightheadedness with hypotension: Unknown Has patient had a PCN reaction causing severe rash involving mucus membranes or skin necrosis: Unknown Has patient had a PCN reaction that required hospitalization: No Has patient had a PCN reaction occurring within the last 10 years: No If all of the above answers are "NO", then may proceed with Cephalosporin use.   . Ciprofloxacin Rash    VITALS:  Blood pressure (!) 137/57, pulse (!) 54, temperature (!) 97.4 F (36.3 C), temperature source Oral, resp. rate 18, height 5' (1.524 m), weight 60.1 kg (132 lb 8 oz), SpO2 99 %.  PHYSICAL EXAMINATION:   Physical Exam  GENERAL:  83 y.o.-year-old patient lying in bed in no acute distress.  EYES: Pupils equal, round, reactive to light and accommodation. No scleral icterus. Extraocular muscles intact.  HEENT: Head atraumatic, normocephalic. Oropharynx and nasopharynx clear.  NECK:  Supple, no jugular venous distention. No thyroid enlargement, no tenderness.  LUNGS: Normal breath sounds bilaterally, no wheezing, rales, rhonchi. No use of accessory muscles of respiration.  CARDIOVASCULAR: S1, S2 normal. No  murmurs, rubs, or gallops.  ABDOMEN: Soft, nontender, nondistended. Bowel sounds present. No organomegaly or mass.  EXTREMITIES: No cyanosis, clubbing or edema b/l.    NEUROLOGIC: Cranial nerves II through XII are intact. No focal Motor or sensory deficits b/l.  Globally weak.  PSYCHIATRIC: The patient is alert and oriented x 1.  SKIN: No obvious rash, lesion, or ulcer.    LABORATORY PANEL:   CBC  Recent Labs Lab 11/28/16 0603  WBC 7.2  HGB 13.1  HCT 38.6  PLT 153   ------------------------------------------------------------------------------------------------------------------  Chemistries   Recent Labs Lab 11/27/16 0436 11/28/16 0603  NA 141 141  K 2.6* 3.5  CL 105 113*  CO2 24 22  GLUCOSE 104* 108*  BUN 24* 22*  CREATININE 1.10* 1.02*  CALCIUM 9.3 8.8*  MG 1.7  --    ------------------------------------------------------------------------------------------------------------------  Cardiac Enzymes  Recent Labs Lab 11/27/16 0436  TROPONINI 4.98*   ------------------------------------------------------------------------------------------------------------------  RADIOLOGY:  Dg Chest 2 View  Result Date: 11/26/2016 CLINICAL DATA:  Back pain after fall EXAM: CHEST  2 VIEW COMPARISON:  CXR and thoracic spine radiograph report from 07/26/2016 FINDINGS: Heart is top-normal in size. There is aortic atherosclerosis without aneurysm. The lungs are clear without pneumothorax or pulmonary consolidations. Axillary clips are seen on the right. Degenerative disc disease consistent with spondylosis noted of the included lower cervical spine and upper thoracic spine. No acute appearing fracture nor bone destruction. IMPRESSION: 1. No active cardiopulmonary disease. 2. Degenerative changes are seen along the included lower cervical and upper thoracic spine. 3. Aortic atherosclerosis. Electronically Signed   By: Meredith Leeds.D.  On: 11/26/2016 19:14   Dg Pelvis 1-2  Views  Result Date: 11/26/2016 CLINICAL DATA:  Patient found on floor around 4 p.m. Left hip tender to touch. EXAM: PELVIS - 1-2 VIEW COMPARISON:  CT 10/19/2015 FINDINGS: Lower lumbar degenerative disc disease from L4 through S1. The bony pelvis and hips appear intact without fracture or dislocations. No diastasis. Injection granulomas noted about the right hip, confirmed on prior CT. Bi-iliofemoral atherosclerotic calcifications are noted. IMPRESSION: Lower lumbar degenerative disc disease from L4 through S1. No acute fracture dislocations. Electronically Signed   By: Ashley Royalty M.D.   On: 11/26/2016 19:19   Ct Head Wo Contrast  Result Date: 11/26/2016 CLINICAL DATA:  Found on the floor today.  Some confusion. EXAM: CT HEAD WITHOUT CONTRAST TECHNIQUE: Contiguous axial images were obtained from the base of the skull through the vertex without intravenous contrast. COMPARISON:  None. FINDINGS: Brain: Generalized brain atrophy. Chronic small-vessel ischemic changes of the cerebral hemispheric white matter. No sign of acute infarction, mass lesion, hemorrhage, hydrocephalus or extra-axial collection. Vascular: There is atherosclerotic calcification of the major vessels at the base of the brain. Skull: Normal Sinuses/Orbits: Clear/ normal Other: None significant IMPRESSION: No acute finding. Age related atrophy and chronic small-vessel ischemic changes of the cerebral hemispheric white matter. Electronically Signed   By: Nelson Chimes M.D.   On: 11/26/2016 20:08   Dg Knee Complete 4 Views Right  Result Date: 11/26/2016 CLINICAL DATA:  Patient was found down around 4 p.m. today by daughter. The patient is believed to have fallen this morning. EXAM: RIGHT KNEE - COMPLETE 4+ VIEW COMPARISON:  None. FINDINGS: No evidence of fracture, dislocation, or joint effusion. Intact total knee arthroplasty with patellar resurfacing. Popliteal and tibial arteriosclerosis is identified. Small phleboliths are seen along the  anterior aspect of the thigh. No evidence of arthropathy or other focal bone abnormality. No soft tissue mass or significant hematoma identified. IMPRESSION: 1. Intact right knee arthroplasty without posttraumatic fracture nor dislocation. 2. No joint effusion. 3. No hardware failure. Electronically Signed   By: Ashley Royalty M.D.   On: 11/26/2016 19:17     ASSESSMENT AND PLAN:   83 year old female with past medical history of dementia, chronic kidney disease stage II, hypertension, history of breast cancer who presented to the hospital after she was found down by her daughter and noted to have acute rhabdomyolysis. Patient also ruled in for a non-ST elevation MI.  1. Non-ST elevation MI-patient ruled in by cardiac markers as her troponin trended upwards as high as 6. She is clinically asymptomatic although. -No acute symptoms and we'll DC heparin drip as per cardiology. Continue aspirin, Toprol and will add statin Rhabdo has improved. - Echo showing normal LV function and no plans for any further intervention as per Cards.  Cont. Medical management.   2. Acute rhabdomyolysis-secondary to her fall. Continue IV fluids - CK's trending down.   3. History of recurrent falls-await physical therapy evaluation patient likely will need short-term rehabilitation.  4. Hypokalemia- improved w/ supplementation and will monitor.   5. Dementia-continue Aricept.  6. Depression-continue Lexapro.  7. History of urinary incontinence-continue Toviaz.  Await PT eval and likely d/c in 1-2 days.   All the records are reviewed and case discussed with Care Management/Social Worker. Management plans discussed with the patient, family and they are in agreement.  CODE STATUS: DNR  DVT Prophylaxis: Hep. gtt  TOTAL TIME TAKING CARE OF THIS PATIENT: 30 minutes.   POSSIBLE D/C IN 1-2 DAYS,  DEPENDING ON CLINICAL CONDITION.   Henreitta Leber M.D on 11/28/2016 at 3:28 PM  Between 7am to 6pm - Pager -  [phone]  After 6pm go to [email or web address] - Proofreader  [facility]  Office  [phone]  CC: Primary care physician; Idelle Crouch, MD

## 2016-11-29 LAB — BASIC METABOLIC PANEL
Anion gap: 5 (ref 5–15)
BUN: 19 mg/dL (ref 6–20)
CALCIUM: 9 mg/dL (ref 8.9–10.3)
CO2: 24 mmol/L (ref 22–32)
Chloride: 112 mmol/L — ABNORMAL HIGH (ref 101–111)
Creatinine, Ser: 0.93 mg/dL (ref 0.44–1.00)
GFR, EST NON AFRICAN AMERICAN: 53 mL/min — AB (ref >60)
Glucose, Bld: 104 mg/dL — ABNORMAL HIGH (ref 65–99)
POTASSIUM: 4 mmol/L (ref 3.5–5.1)
SODIUM: 141 mmol/L (ref 135–145)

## 2016-11-29 LAB — CK: CK TOTAL: 745 U/L — AB (ref 38–234)

## 2016-11-29 MED ORDER — ATORVASTATIN CALCIUM 20 MG PO TABS
40.0000 mg | ORAL_TABLET | Freq: Every day | ORAL | Status: DC
  Administered 2016-11-29 – 2016-12-02 (×4): 40 mg via ORAL
  Filled 2016-11-29 (×4): qty 2

## 2016-11-29 MED ORDER — AMLODIPINE BESYLATE 5 MG PO TABS
5.0000 mg | ORAL_TABLET | Freq: Every day | ORAL | Status: DC
  Administered 2016-11-29 – 2016-12-03 (×5): 5 mg via ORAL
  Filled 2016-11-29 (×5): qty 1

## 2016-11-29 MED ORDER — LISINOPRIL 5 MG PO TABS
5.0000 mg | ORAL_TABLET | Freq: Every day | ORAL | Status: DC
  Administered 2016-11-29 – 2016-12-03 (×5): 5 mg via ORAL
  Filled 2016-11-29 (×5): qty 1

## 2016-11-29 MED ORDER — HYDRALAZINE HCL 20 MG/ML IJ SOLN: 10.0000 mg | Freq: Four times a day (QID) | INTRAMUSCULAR | Status: DC | PRN

## 2016-11-29 NOTE — Clinical Social Work Note (Signed)
Clinical Social Work Assessment  Patient Details  Name: Kristin Brewer MRN: [id number] Date of Birth: [birth year]  Date of referral:  11/29/16               Reason for consult:  Facility Placement                Permission sought to share information with:  Family Supports Permission granted to share information::  Yes, Verbal Permission Granted  Name::     Avillion,Elaine Daughter [phone]   Agency::  SNF admissions  Relationship::     Contact Information:     Housing/Transportation Living arrangements for the past 2 months:  Single Family Home Source of Information:  Adult Children Patient Interpreter Needed:  None Criminal Activity/Legal Involvement Pertinent to Current Situation/Hospitalization:  No - Comment as needed Significant Relationships:  Adult Children Lives with:  Adult Children Do you feel safe going back to the place where you live?  No Need for family participation in patient care:  Yes (Comment) (Patient has dementia)  Care giving concerns:  Patient's daughter feels patient needs some short term rehab before she is able to return home.   Social Worker assessment / plan:  Patient is an 83 year old female who is alert and oriented x3.  Patient lives with her daughter, however daughter works during the day and patient has had some recent falls.  Due to patient's dementia, CSW completed assessment by speaking with patient's daughter.  Patient has not been to rehab before, CSW explained to patient's daughter what to expect and how insurance will pay for her stay at [facility].  Patient's daughter was explained the process of trying to find placement for her.  Patient's daughter feels like patient may need to go to an ALF after she has received rehab due to patient's health and dementia getting worse.  CSW explained the difference between ALFs and SNFs, and how she would have to pay privately for ALF.  Patient's daughter was grateful for information given.  Patient's daughter gave  CSW permission to begin bed search process in [place].  Patient's daughter did not express any other concerns or issues.  Employment status:  Retired Nurse, adult PT Recommendations:  [place] / Referral to community resources:  [place]  Patient/Family's Response to care: Patient's daughter in agreement to having patient go to SNF for short term rehab. Patient/Family's Understanding of and Emotional Response to Diagnosis, Current Treatment, and Prognosis:  Patient's daughter is aware of current diagnosis and prognosis.  Emotional Assessment Appearance:  Appears stated age Attitude/Demeanor/Rapport:    Affect (typically observed):  Calm, Appropriate Orientation:  Oriented to Self, Oriented to Place, Oriented to Situation Alcohol / Substance use:  Not Applicable Psych involvement (Current and /or in the community):     Discharge Needs  Concerns to be addressed:  Lack of Support Readmission within the last 30 days:  No Current discharge risk:  Physical Impairment Barriers to Discharge:  Continued Medical Work up, [employer]   Anell Barr 11/29/2016, 6:43 PM

## 2016-11-29 NOTE — Evaluation (Signed)
Physical Therapy Evaluation Patient Details Name: Kristin Brewer MRN: [id number] DOB: 04/03/1927 Today's Date: 11/29/2016   History of Present Illness  Kristin Brewer is a 83 y.o. female with a known history of Breast cancer, hypertension, kidney disease, renal insufficiency- lives with her daughter and son in [place]. Pt wanted to get up and go to the bathroom when she fell down. Daughter was not at home and patient could not get up from there until she returned back home in the afternoon. Patient denies any loss of consciousness, palpitation, chest pain, shortness of breath, focal weakness with this episode. Daughter helped her to get up and go to the bed where patient stayed for half an hour then again tried to get up and go somewhere and she fell down. Concerned with this daughter brought her to the emergency room. Her urinalysis is negative but she is noted to have elevated CK level and her troponin is also high. So she started on heparin IV drip by ER physician and given to hospitalist service for further management. She is now admitted for acute rhabdomyolysis, fall, and elevated troponin (NSTEMI vs supply/demand ischemia).   Clinical Impression  Pt admitted with above diagnosis. Pt currently with functional limitations due to the deficits listed below (see PT Problem List). Pt requiring assist for bed mobility and transfers. She moves slowly and is significantly weak during evaluation. Pt ambulates with rolling walker in hallway. Initially requires minA+1 to stabilize but progresses to mostly CGA. HR remains in 70's bpm during ambulation and SaO2>90% on room air without DOE. Cues for safety with turns. Pt unable to place feet together without assist from UE support. Once in narrow stance she is able to maintain but loses balance once eyes are closed. Unable to attempt single leg balance with cues as she falls immediately. Pt is weak and is a significant fall risk. She will require SNF placement at discharge in  order to facilitate safe transition back home. Pt will benefit from PT services to address deficits in strength, balance, and mobility in order to return to full function at home.       Follow Up Recommendations SNF    Equipment Recommendations  None recommended by PT;Other (comment) (Should be using rolling walker)    Recommendations for Other Services       Precautions / Restrictions Precautions Precautions: Fall Restrictions Weight Bearing Restrictions: No      Mobility  Bed Mobility Overal bed mobility: Needs Assistance Bed Mobility: Supine to Sit     Supine to sit: Mod assist     General bed mobility comments: Pt requires modA+1 to go from supine to sitting. HOB elevated.  Transfers Overall transfer level: Needs assistance Equipment used: Rolling walker (2 wheeled) Transfers: Sit to/from Stand Sit to Stand: Min assist         General transfer comment: Pt requires increased time and assist to come to standing. Initially unsteady in standing requiring UE support and external assist to remain upright  Ambulation/Gait Ambulation/Gait assistance: Min assist Ambulation Distance (Feet): 120 Feet Assistive device: Rolling walker (2 wheeled) Gait Pattern/deviations: Decreased step length - right;Decreased step length - left Gait velocity: Decreased Gait velocity interpretation: <1.8 ft/sec, indicative of risk for recurrent falls General Gait Details: Pt ambulates with rolling walker in hallway. Initially requires minA+1 to stabilize but progresses to mostly CGA. HR remains in 70's bpm during ambulation and SaO2>90% on room air without DOE. Cues for safety with turns.   Stairs  Wheelchair Mobility    Modified Rankin (Stroke Patients Only)       Balance Overall balance assessment: Needs assistance Sitting-balance support: No upper extremity supported Sitting balance-Leahy Scale: Good     Standing balance support: Bilateral upper extremity  supported Standing balance-Leahy Scale: Fair Standing balance comment: Poor initially, improves with extended time in standing. Pt unable to place feet together without assist. Once in narrow stance she is able to maintain but loses balance once eyes are closed. Unable to attempt single leg balance due to poor stability                             Pertinent Vitals/Pain Pain Assessment: Faces Faces Pain Scale: Hurts a little bit Pain Location: Pt denies pain at rest but reports she has a history of chronic low back pain which bothers her whenever she is walking Pain Intervention(s): Monitored during session    [place] expects to be discharged to:: Private residence Living Arrangements: Children Available Help at Discharge: Family Type of Home: House Home Access: Stairs to enter Entrance Stairs-Rails: Can reach both;Right;Left Entrance Stairs-Number of Steps: 4 Home Layout: Multi-level;Bed/bath upstairs Home Equipment: Cane - quad;Walker - 2 wheels (No BSC, no shower chair)      Prior Function Level of Independence: Needs assistance   Gait / Transfers Assistance Needed: Pt reqports that she ambulates with a quad cane. Reports 2 falls in the last 12 months  ADL's / Homemaking Assistance Needed: Independent with ADLs, requires assist for IADLs.        Hand Dominance   Dominant Hand: Right    Extremity/Trunk Assessment   Upper Extremity Assessment Upper Extremity Assessment: Generalized weakness    Lower Extremity Assessment Lower Extremity Assessment: Generalized weakness       Communication   Communication: No difficulties  Cognition Arousal/Alertness: Awake/alert Behavior During Therapy: WFL for tasks assessed/performed Overall Cognitive Status: History of cognitive impairments - at baseline                                 General Comments: AOx2, unclear if this is close to her baseline. Pt has a history of dementia       General Comments      Exercises     Assessment/Plan    PT Assessment Patient needs continued PT services  PT Problem List Decreased strength;Decreased activity tolerance;Decreased balance;Decreased mobility;Decreased knowledge of use of DME;Decreased safety awareness;Decreased cognition       PT Treatment Interventions DME instruction;Gait training;Stair training;Functional mobility training;Therapeutic exercise;Therapeutic activities;Balance training;Neuromuscular re-education;Cognitive remediation;Patient/family education    PT Goals (Current goals can be found in the Care Plan section)  Acute Rehab PT Goals Patient Stated Goal: Return to prior function at home PT Goal Formulation: With patient Time For Goal Achievement: 12/13/16 Potential to Achieve Goals: Fair    Frequency Min 2X/week   Barriers to discharge        Co-evaluation               AM-PAC PT "6 Clicks" Daily Activity  Outcome Measure Difficulty turning over in bed (including adjusting bedclothes, sheets and blankets)?: Total Difficulty moving from lying on back to sitting on the side of the bed? : Total Difficulty sitting down on and standing up from a chair with arms (e.g., wheelchair, bedside commode, etc,.)?: Total Help needed moving to and from a bed to  chair (including a wheelchair)?: A Little Help needed walking in hospital room?: A Little Help needed climbing 3-5 steps with a railing? : A Lot 6 Click Score: 11    End of Session Equipment Utilized During Treatment: Gait belt Activity Tolerance: Patient tolerated treatment well Patient left: in bed;with call bell/phone within reach;with bed alarm set   PT Visit Diagnosis: Unsteadiness on feet (R26.81);Repeated falls (R29.6);Muscle weakness (generalized) (M62.81)    Time: 1030-1057 PT Time Calculation (min) (ACUTE ONLY): 27 min   Charges:   PT Evaluation $PT Eval Low Complexity: 1 Procedure PT Treatments $Therapeutic Activity: 8-22  mins   PT G Codes:   PT G-Codes **NOT FOR INPATIENT CLASS** Functional Assessment Tool Used: AM-PAC 6 Clicks Basic Mobility Functional Limitation: Mobility: Walking and moving around Mobility: Walking and Moving Around Current Status (E1583): At least 60 percent but less than 80 percent impaired, limited or restricted Mobility: Walking and Moving Around Goal Status [phone]): At least 20 percent but less than 40 percent impaired, limited or restricted    Phillips Grout PT, DPT   Suhana Wilner 11/29/2016, 11:30 AM

## 2016-11-29 NOTE — Clinical Social Work Note (Signed)
CSW spoke to patient's daughter in regards to bed offers.  Patient's daughter would like patient to go to [facility] for short term rehab.  CSW contacted [facility] and they can accept patient once insurance authorization has been approved.  CSW continuing to follow patient's progress throughout discharge planning.  Jones Broom. [place], MSW, [place]  11/29/2016 6:40 PM

## 2016-11-29 NOTE — Plan of Care (Signed)
Problem: Cardiac: Goal: Ability to achieve and maintain adequate cardiopulmonary perfusion will improve Outcome: Progressing No voiced complaints of chest pain.  Cardiac markers continue to trend downwards.

## 2016-11-29 NOTE — NC FL2 (Signed)
[place] LEVEL OF CARE SCREENING TOOL     IDENTIFICATION  Patient Name: Kristin Brewer Birthdate: 11-22-1926 Sex: female Admission Date (Current Location): 11/26/2016  [place] and [place] Number:  Engineering geologist and Address:  [facility], [address], [place], [place] [address]      Provider Number: [id number]  Attending Physician Name and Address:  Henreitta Leber, MD  Relative Name and Phone Number:  Elwyn Reach Daughter [phone]     Current Level of Care: Hospital Recommended Level of Care: [place] Prior Approval Number:    Date Approved/Denied:   PASRR Number: [id number]  Discharge Plan: SNF    Current Diagnoses: Patient Active Problem List   Diagnosis Date Noted  . Fall 11/27/2016  . Dementia 11/27/2016  . Hypokalemia 11/27/2016  . Leukocytosis 11/27/2016  . CKD (chronic kidney disease), stage II 11/27/2016  . Elevated troponin 11/26/2016  . Rhabdomyolysis 11/26/2016  . Essential hypertension, benign 06/14/2016  . Carotid artery disease ([place]) 06/14/2016  . Aortic atherosclerosis ([place]) 06/14/2016  . PVD (peripheral vascular disease) ([place]) 06/14/2016    Orientation RESPIRATION BLADDER Height & Weight     Self, Place, Situation  Normal Continent Weight: 132 lb 8 oz (60.1 kg) Height:  5' (152.4 cm)  BEHAVIORAL SYMPTOMS/MOOD NEUROLOGICAL BOWEL NUTRITION STATUS      Continent Diet (Heart Healthy diet)  AMBULATORY STATUS COMMUNICATION OF NEEDS Skin   Limited Assist Verbally Normal                       Personal Care Assistance Level of Assistance  Bathing, Feeding, Dressing Bathing Assistance: Limited assistance Feeding assistance: Independent Dressing Assistance: Limited assistance     Functional Limitations Info  Sight, Hearing, Speech Sight Info: Adequate Hearing Info: Adequate Speech Info: Adequate    SPECIAL CARE FACTORS FREQUENCY  PT (By licensed PT), OT (By licensed  OT)     PT Frequency: 5x a week OT Frequency: 2x a week            Contractures Contractures Info: Not present    Additional Factors Info  Code Status, Allergies, Psychotropic Code Status Info: DNR Allergies Info: PENICILLINS, CIPROFLOXACIN  Psychotropic Info: escitalopram (LEXAPRO) tablet 10 mg         Current Medications (11/29/2016):  This is the current hospital active medication list Current Facility-Administered Medications  Medication Dose Route Frequency Provider Last Rate Last Dose  . 0.9 %  sodium chloride infusion   Intravenous Continuous Henreitta Leber, MD 75 mL/hr at 11/29/16 0352    . acetaminophen (TYLENOL) tablet 650 mg  650 mg Oral Q6H PRN Henreitta Leber, MD   650 mg at 11/29/16 0839  . aspirin EC tablet 81 mg  81 mg Oral Daily Vaughan Basta, MD   81 mg at 11/29/16 0839  . docusate sodium (COLACE) capsule 100 mg  100 mg Oral BID PRN Vaughan Basta, MD      . donepezil (ARICEPT) tablet 5 mg  5 mg Oral QHS Vaughan Basta, MD   5 mg at 11/28/16 2056  . escitalopram (LEXAPRO) tablet 10 mg  10 mg Oral Daily Vaughan Basta, MD   10 mg at 11/29/16 0840  . famotidine (PEPCID) tablet 20 mg  20 mg Oral Daily Vaughan Basta, MD   20 mg at 11/29/16 0839  . fesoterodine (TOVIAZ) tablet 4 mg  4 mg Oral Daily Vaughan Basta, MD   4 mg at 11/29/16 0840  .  haloperidol lactate (HALDOL) injection 2.5 mg  2.5 mg Intravenous Q6H PRN Henreitta Leber, MD   2.5 mg at 11/27/16 1549  . metoprolol succinate (TOPROL-XL) 24 hr tablet 50 mg  50 mg Oral Daily Vaughan Basta, MD   50 mg at 11/29/16 0840  . potassium chloride SA (K-DUR,KLOR-CON) CR tablet 20 mEq  20 mEq Oral BID Henreitta Leber, MD   20 mEq at 11/29/16 2800     Discharge Medications: Please see discharge summary for a list of discharge medications.  Relevant Imaging Results:  Relevant Lab Results:   Additional Information SSN [id number]  Ross Ludwig,  [place]

## 2016-11-29 NOTE — Progress Notes (Signed)
Kristin Brewer at [place] NAME: Kristin Brewer    MR#:  [id number]  DATE OF BIRTH:  03/10/1927  SUBJECTIVE:   CKs are improving. No other acute events overnight. Asymptomatic.  REVIEW OF SYSTEMS:    Review of Systems  Unable to perform ROS: Dementia    Nutrition: Heart Healthy Tolerating Diet: Yes Tolerating PT: Evaluation noted.   DRUG ALLERGIES:   Allergies  Allergen Reactions  . Penicillins     Has patient had a PCN reaction causing immediate rash, facial/tongue/throat swelling, SOB or lightheadedness with hypotension: Unknown Has patient had a PCN reaction causing severe rash involving mucus membranes or skin necrosis: Unknown Has patient had a PCN reaction that required hospitalization: No Has patient had a PCN reaction occurring within the last 10 years: No If all of the above answers are "NO", then may proceed with Cephalosporin use.   . Ciprofloxacin Rash    VITALS:  Blood pressure (!) 187/56, pulse (!) 58, temperature 97.7 F (36.5 C), temperature source Oral, resp. rate 18, height 5' (1.524 m), weight 60.1 kg (132 lb 8 oz), SpO2 97 %.  PHYSICAL EXAMINATION:   Physical Exam  GENERAL:  83 y.o.-year-old patient lying in bed in no acute distress.  EYES: Pupils equal, round, reactive to light and accommodation. No scleral icterus. Extraocular muscles intact.  HEENT: Head atraumatic, normocephalic. Oropharynx and nasopharynx clear.  NECK:  Supple, no jugular venous distention. No thyroid enlargement, no tenderness.  LUNGS: Normal breath sounds bilaterally, no wheezing, rales, rhonchi. No use of accessory muscles of respiration.  CARDIOVASCULAR: S1, S2 normal. No murmurs, rubs, or gallops.  ABDOMEN: Soft, nontender, nondistended. Bowel sounds present. No organomegaly or mass.  EXTREMITIES: No cyanosis, clubbing or edema b/l.    NEUROLOGIC: Cranial nerves II through XII are intact. No focal Motor or sensory deficits b/l.  Globally  weak.  PSYCHIATRIC: The patient is alert and oriented x 1.  SKIN: No obvious rash, lesion, or ulcer.    LABORATORY PANEL:   CBC  Recent Labs Lab 11/28/16 0603  WBC 7.2  HGB 13.1  HCT 38.6  PLT 153   ------------------------------------------------------------------------------------------------------------------  Chemistries   Recent Labs Lab 11/28/16 0603 11/29/16 0655  NA 141 141  K 3.5 4.0  CL 113* 112*  CO2 22 24  GLUCOSE 108* 104*  BUN 22* 19  CREATININE 1.02* 0.93  CALCIUM 8.8* 9.0  MG 2.2  --    ------------------------------------------------------------------------------------------------------------------  Cardiac Enzymes  Recent Labs Lab 11/27/16 0436  TROPONINI 4.98*   ------------------------------------------------------------------------------------------------------------------  RADIOLOGY:  No results found.   ASSESSMENT AND PLAN:   83 year old female with past medical history of dementia, chronic kidney disease stage II, hypertension, history of breast cancer who presented to the hospital after she was found down by her daughter and noted to have acute rhabdomyolysis. Patient also ruled in for a non-ST elevation MI.  1. Non-ST elevation MI-patient ruled in by cardiac markers as her troponin trended upwards as high as 6. She is clinically asymptomatic although. -Echo showing normal LV function and no plans for any acute intervention given her advanced dementia and as patient is asymptomatic. Continue medical management for now. - cont. ASA, Toprol, Will add high intensity Statin, low dose ACE today.   2. Acute rhabdomyolysis-secondary to her fall. CK's trending down  - CK's trending down and will d/c IV fluids now.   3. History of recurrent falls- Seen by PT and they recommend short-term rehabilitation but awaiting insurance  authorization.  4. Hypokalemia- improved w/ supplementation and will monitor.   5. Dementia-continue  Aricept.  6. Depression-continue Lexapro.  7. History of urinary incontinence-continue Toviaz.  Discharge to skilled nursing facility once insurance authorization obtained.   All the records are reviewed and case discussed with Care Management/Social Worker. Management plans discussed with the patient, family and they are in agreement.  CODE STATUS: DNR  DVT Prophylaxis: Hep. gtt  TOTAL TIME TAKING CARE OF THIS PATIENT: 30 minutes.   POSSIBLE D/C IN 1-2 DAYS, DEPENDING ON CLINICAL CONDITION.   Kristin Brewer M.D on 11/29/2016 at 3:14 PM  Between 7am to 6pm - Pager - [phone]  After 6pm go to [email or web address] - Proofreader  [facility]  Office  [phone]  CC: Primary care physician; Kristin Crouch, MD

## 2016-11-29 NOTE — Evaluation (Signed)
Occupational Therapy Evaluation Patient Details Name: Kristin Brewer MRN: [id number] DOB: August 06, 1926 Today's Date: 11/29/2016    History of Present Illness Pt. is an 83 y.o. female with a known history of Breast cancer, hypertension, kidney disease, renal insufficiency who lives with her daughter and son in law.  Pt. moved her from [place]. Pt. fell down attempting to use the bathroom. Pt's daughter was not home, therefore pt. stayed on the floor until her daughter returned. Patient denies any loss of consciousness, palpitation, chest pain, shortness of breath, focal weakness with this episode. Daughter helped her to get up and go to the bed where patient stayed for half an hour then again tried to get up and go somewhere and she fell down. Concerned with this daughter brought her to the emergency room. Her urinalysis is negative but she is noted to have elevated CK level and her troponin is also high. So she started on heparin IV drip by ER physician and given to hospitalist service for further management. She is now admitted for acute rhabdomyolysis, fall, and elevated troponin (NSTEMI vs supply/demand ischemia).    Clinical Impression   Pt. Is an 83 y.o. Female who presents with weakness, impaired functional mobility, limited cognition, impaired safety awareness, history of back pain, and a history of falling which hinder her ability to complete ADL, and IADL tasks. Pt. could benefit from skilled OT services for ADL training, A/E training, work simplification techniques, and energy conservation, and pt education about safety awareness, home modification, and DME.  Pt. Could benefit from SNF level of care with follow-up OT services upon discharge.    Follow Up Recommendations  SNF    Equipment Recommendations       Recommendations for Other Services       Precautions / Restrictions Precautions Precautions: Fall Restrictions Weight Bearing Restrictions: No                                                    ADL either performed or assessed with clinical judgement   ADL Overall ADL's : Needs assistance/impaired Eating/Feeding: Set up   Grooming: Set up   Upper Body Bathing: Minimal assistance   Lower Body Bathing: Moderate assistance   Upper Body Dressing : Minimal assistance   Lower Body Dressing: Moderate assistance               Functional mobility during ADLs: Minimal assistance General ADL Comments: pt. education was provided about A/E use for LE ADLs, positioning, and work simplification techniques.     Vision         Perception     Praxis      Pertinent Vitals/Pain Pain Assessment: No/denies pain Pain Score: 0-No pain     Hand Dominance Right   Extremity/Trunk Assessment Upper Extremity Assessment Upper Extremity Assessment: Generalized weakness           Communication Communication Communication: No difficulties   Cognition Arousal/Alertness: Awake/alert Behavior During Therapy: WFL for tasks assessed/performed Overall Cognitive Status: History of cognitive impairments - at baseline                                     General Comments       Exercises     Shoulder Instructions  Home Living Family/patient expects to be discharged to:: Private residence Living Arrangements: Children Available Help at Discharge: Family Type of Home: House Home Access: Stairs to enter Technical brewer of Steps: 4 Entrance Stairs-Rails: Can reach both;Right;Left Home Layout: Multi-level;Bed/bath upstairs Alternate Level Stairs-Number of Steps: 15 Alternate Level Stairs-Rails: Right;Left;Can reach both     Biochemist, clinical: Standard     Home Equipment: Latina Craver - 2 wheels          Prior Functioning/Environment Level of Independence: Needs assistance    ADL's / Homemaking Assistance Needed: Independent with ADLs. Daughter assists with medication management. Son-in-law  assists pt with meals.             OT Problem List: Decreased strength;Decreased activity tolerance;Decreased safety awareness;Decreased knowledge of use of DME or AE;Decreased cognition;Impaired balance (sitting and/or standing)      OT Treatment/Interventions: Self-care/ADL training;Therapeutic exercise;Energy conservation;Patient/family education;Cognitive remediation/compensation;DME and/or AE instruction;Therapeutic activities    OT Goals(Current goals can be found in the care plan section) Acute Rehab OT Goals Patient Stated Goal: To be able to help her daughter more. OT Goal Formulation: With patient Potential to Achieve Goals: Good  OT Frequency: Min 2X/week   Barriers to D/C:            Co-evaluation              AM-PAC PT "6 Clicks" Daily Activity     Outcome Measure Help from another person eating meals?: None Help from another person taking care of personal grooming?: A Little Help from another person toileting, which includes using toliet, bedpan, or urinal?: A Lot Help from another person bathing (including washing, rinsing, drying)?: A Lot Help from another person to put on and taking off regular upper body clothing?: A Little Help from another person to put on and taking off regular lower body clothing?: A Lot 6 Click Score: 16   End of Session    Activity Tolerance: Patient tolerated treatment well Patient left: in bed;with call bell/phone within reach;with bed alarm set  OT Visit Diagnosis: Muscle weakness (generalized) (M62.81);History of falling (Z91.81)                Time: 1310-1330 OT Time Calculation (min): 20 min Charges:  OT General Charges $OT Visit: 1 Procedure OT Evaluation $OT Eval Moderate Complexity: 1 Procedure G-Codes:     Harrel Carina, MS, OTR/L   Harrel Carina, MS, OTR/L 11/29/2016, 3:45 PM

## 2016-11-29 NOTE — Progress Notes (Signed)
md was made aware of pt's high bp , see new order , will continue to monitor

## 2016-11-29 NOTE — Care Management Important Message (Signed)
Important Message  Patient Details  Name: Kristin Brewer MRN: [id number] Date of Birth: 02/04/1927   Medicare Important Message Given:  Yes    Katrina Stack, RN 11/29/2016, 11:11 AM

## 2016-11-29 NOTE — Clinical Social Work Placement (Signed)
   CLINICAL SOCIAL WORK PLACEMENT  NOTE  Date:  11/29/2016  Patient Details  Name: Kristin Brewer MRN: [id number] Date of Birth: August 11, 1926  Clinical Social Work is seeking post-discharge placement for this patient at the [place] level of care (*CSW will initial, date and re-position this form in  chart as items are completed):  Yes   Patient/family provided with [facility] Department's list of facilities offering this level of care within the geographic area requested by the patient (or if unable, by the patient's family).  Yes   Patient/family informed of their freedom to choose among providers that offer the needed level of care, that participate in Medicare, Medicaid or managed care program needed by the patient, have an available bed and are willing to accept the patient.  Yes   Patient/family informed of [facility]'s ownership interest in [facility] and [facility], as well as of the fact that they are under no obligation to receive care at these facilities.  PASRR submitted to EDS on 11/29/16     PASRR number received on 11/29/16     Existing PASRR number confirmed on       FL2 transmitted to all facilities in geographic area requested by pt/family on 11/29/16     FL2 transmitted to all facilities within larger geographic area on       Patient informed that his/her managed care company has contracts with or will negotiate with certain facilities, including the following:        Yes   Patient/family informed of bed offers received.  Patient chooses bed at [facility]     Physician recommends and patient chooses bed at      Patient to be transferred to   on  .  Patient to be transferred to facility by       Patient family notified on   of transfer.  Name of family member notified:        PHYSICIAN Please sign FL2, Please sign DNR     Additional Comment:    _______________________________________________ Ross Ludwig, LCSWA 11/29/2016, 6:48 PM

## 2016-11-30 NOTE — Progress Notes (Signed)
[place] at [place] NAME: Kristin Brewer    MR#:  [id number]  DATE OF BIRTH:  [birth year]  SUBJECTIVE:   CK's much improved.  No chest pain, shortness of breath, awaiting insurance authorization for placement to skilled nursing facility.  REVIEW OF SYSTEMS:    Review of Systems  Unable to perform ROS: Dementia    Nutrition: Heart Healthy Tolerating Diet: Yes Tolerating PT: Evaluation noted.   DRUG ALLERGIES:   Allergies  Allergen Reactions  . Penicillins     Has patient had a PCN reaction causing immediate rash, facial/tongue/throat swelling, SOB or lightheadedness with hypotension: Unknown Has patient had a PCN reaction causing severe rash involving mucus membranes or skin necrosis: Unknown Has patient had a PCN reaction that required hospitalization: No Has patient had a PCN reaction occurring within the last 10 years: No If all of the above answers are "NO", then may proceed with Cephalosporin use.   . Ciprofloxacin Rash    VITALS:  Blood pressure (!) 164/53, pulse (!) 55, temperature 97.7 F (36.5 C), temperature source Oral, resp. rate 16, height 5' (1.524 m), weight 60.1 kg (132 lb 8 oz), SpO2 96 %.  PHYSICAL EXAMINATION:   Physical Exam  GENERAL:  83 y.o.-year-old patient lying in bed in no acute distress.  EYES: Pupils equal, round, reactive to light and accommodation. No scleral icterus. Extraocular muscles intact.  HEENT: Head atraumatic, normocephalic. Oropharynx and nasopharynx clear.  NECK:  Supple, no jugular venous distention. No thyroid enlargement, no tenderness.  LUNGS: Normal breath sounds bilaterally, no wheezing, rales, rhonchi. No use of accessory muscles of respiration.  CARDIOVASCULAR: S1, S2 normal. No murmurs, rubs, or gallops.  ABDOMEN: Soft, nontender, nondistended. Bowel sounds present. No organomegaly or mass.  EXTREMITIES: No cyanosis, clubbing or edema b/l.    NEUROLOGIC: Cranial nerves II  through XII are intact. No focal Motor or sensory deficits b/l.  Globally weak.  PSYCHIATRIC: The patient is alert and oriented x 1.  SKIN: No obvious rash, lesion, or ulcer.    LABORATORY PANEL:   CBC  Recent Labs Lab 11/28/16 0603  WBC 7.2  HGB 13.1  HCT 38.6  PLT 153   ------------------------------------------------------------------------------------------------------------------  Chemistries   Recent Labs Lab 11/28/16 0603 11/29/16 0655  NA 141 141  K 3.5 4.0  CL 113* 112*  CO2 22 24  GLUCOSE 108* 104*  BUN 22* 19  CREATININE 1.02* 0.93  CALCIUM 8.8* 9.0  MG 2.2  --    ------------------------------------------------------------------------------------------------------------------  Cardiac Enzymes  Recent Labs Lab 11/27/16 0436  TROPONINI 4.98*   ------------------------------------------------------------------------------------------------------------------  RADIOLOGY:  No results found.   ASSESSMENT AND PLAN:   83 year old female with past medical history of dementia, chronic kidney disease stage II, hypertension, history of breast cancer who presented to the hospital after she was found down by her daughter and noted to have acute rhabdomyolysis. Patient also ruled in for a non-ST elevation MI.  1. Non-ST elevation MI-patient ruled in by cardiac markers as her troponin trended upwards as high as 6. She has remained clinically asymptomatic although. -Echo showing normal LV function and no plans for any acute intervention given her advanced dementia and as the patient is asymptomatic. Continue medical management for now. - cont. ASA, Toprol, Atorvastatin, Lisinopril.   2. Acute rhabdomyolysis-secondary to her fall. CK's trending down  - CK's trending down and Off fluids now and stable.   3. History of recurrent falls- Seen by PT and they recommend  short-term rehabilitation but awaiting insurance authorization.  4. Hypokalemia- improved and  resolved w/ supplementation.   5. Dementia-continue Aricept.  6. Depression-continue Lexapro.  7. History of urinary incontinence-continue Toviaz.  Discharge to skilled nursing facility once insurance authorization obtained.   All the records are reviewed and case discussed with Care Management/Social Worker. Management plans discussed with the patient, family and they are in agreement.  CODE STATUS: DNR  DVT Prophylaxis: Hep. gtt  TOTAL TIME TAKING CARE OF THIS PATIENT: 25 minutes.   POSSIBLE D/C IN 1-2 DAYS, DEPENDING ON CLINICAL CONDITION.   Henreitta Leber M.D on 11/30/2016 at 2:54 PM  Between 7am to 6pm - Pager - [phone]  After 6pm go to [email or web address] - Proofreader  [facility] [place]  Office  [phone]  CC: Primary care physician; Idelle Crouch, MD

## 2016-12-01 NOTE — Progress Notes (Signed)
[facility] at [place] NAME: Kristin Brewer    MR#:  [id number]  DATE OF BIRTH:  1926-11-11  SUBJECTIVE:   No acute events overnight.  No complaints.  Awaiting insurance authorization for d/c to SNF.   REVIEW OF SYSTEMS:    Review of Systems  Unable to perform ROS: Dementia    Nutrition: Heart Healthy Tolerating Diet: Yes Tolerating PT: Evaluation noted.   DRUG ALLERGIES:   Allergies  Allergen Reactions  . Penicillins     Has patient had a PCN reaction causing immediate rash, facial/tongue/throat swelling, SOB or lightheadedness with hypotension: Unknown Has patient had a PCN reaction causing severe rash involving mucus membranes or skin necrosis: Unknown Has patient had a PCN reaction that required hospitalization: No Has patient had a PCN reaction occurring within the last 10 years: No If all of the above answers are "NO", then may proceed with Cephalosporin use.   . Ciprofloxacin Rash    VITALS:  Blood pressure (!) 151/70, pulse 62, temperature (!) 97.3 F (36.3 C), temperature source Oral, resp. rate 18, height 5' (1.524 m), weight 60.1 kg (132 lb 8 oz), SpO2 96 %.  PHYSICAL EXAMINATION:   Physical Exam  GENERAL:  83 y.o.-year-old patient lying in bed in no acute distress.  EYES: Pupils equal, round, reactive to light and accommodation. No scleral icterus. Extraocular muscles intact.  HEENT: Head atraumatic, normocephalic. Oropharynx and nasopharynx clear.  NECK:  Supple, no jugular venous distention. No thyroid enlargement, no tenderness.  LUNGS: Normal breath sounds bilaterally, no wheezing, rales, rhonchi. No use of accessory muscles of respiration.  CARDIOVASCULAR: S1, S2 normal. No murmurs, rubs, or gallops.  ABDOMEN: Soft, nontender, nondistended. Bowel sounds present. No organomegaly or mass.  EXTREMITIES: No cyanosis, clubbing or edema b/l.    NEUROLOGIC: Cranial nerves II through XII are intact. No focal Motor or  sensory deficits b/l.  Globally weak.  PSYCHIATRIC: The patient is alert and oriented x 1.  SKIN: No obvious rash, lesion, or ulcer.    LABORATORY PANEL:   CBC  Recent Labs Lab 11/28/16 0603  WBC 7.2  HGB 13.1  HCT 38.6  PLT 153   ------------------------------------------------------------------------------------------------------------------  Chemistries   Recent Labs Lab 11/28/16 0603 11/29/16 0655  NA 141 141  K 3.5 4.0  CL 113* 112*  CO2 22 24  GLUCOSE 108* 104*  BUN 22* 19  CREATININE 1.02* 0.93  CALCIUM 8.8* 9.0  MG 2.2  --    ------------------------------------------------------------------------------------------------------------------  Cardiac Enzymes  Recent Labs Lab 11/27/16 0436  TROPONINI 4.98*   ------------------------------------------------------------------------------------------------------------------  RADIOLOGY:  No results found.   ASSESSMENT AND PLAN:   83 year old female with past medical history of dementia, chronic kidney disease stage II, hypertension, history of breast cancer who presented to the hospital after she was found down by her daughter and noted to have acute rhabdomyolysis. Patient also ruled in for a non-ST elevation MI.  1. Non-ST elevation MI-patient had ruled in by cardiac markers as her troponin trended upwards as high as 6. She has remained clinically asymptomatic although. -Echo showing normal LV function and no plans for any acute intervention given her advanced dementia and as the patient is asymptomatic. Continue medical management for now. - cont. ASA, Toprol, Atorvastatin, Lisinopril.   2. Acute rhabdomyolysis-secondary to her fall. CK's have trended down and off IV fluids now.    3. History of recurrent falls- Seen by PT and they recommend short-term rehabilitation but awaiting insurance authorization.  4. Hypokalemia- improved and resolved w/ supplementation.   5. Dementia-continue Aricept.  6.  Depression-continue Lexapro.  7. History of urinary incontinence-continue Toviaz.  Discharge to skilled nursing facility once insurance authorization obtained.   All the records are reviewed and case discussed with Care Management/Social Worker. Management plans discussed with the patient, family and they are in agreement.  CODE STATUS: DNR  DVT Prophylaxis: Hep. gtt  TOTAL TIME TAKING CARE OF THIS PATIENT: 25 minutes.   POSSIBLE D/C IN 1-2 DAYS, DEPENDING ON CLINICAL CONDITION.   Henreitta Leber M.D on 12/01/2016 at 3:21 PM  Between 7am to 6pm - Pager - [phone]  After 6pm go to [email or web address] - Proofreader  [facility]  Office  [phone]  CC: Primary care physician; Idelle Crouch, MD

## 2016-12-01 NOTE — Progress Notes (Signed)
No significant change this shift,

## 2016-12-02 LAB — CK: CK TOTAL: 169 U/L (ref 38–234)

## 2016-12-02 MED ORDER — SODIUM CHLORIDE 0.9% FLUSH
3.0000 mL | Freq: Two times a day (BID) | INTRAVENOUS | Status: DC
  Administered 2016-12-02 – 2016-12-03 (×2): 3 mL via INTRAVENOUS

## 2016-12-02 NOTE — Clinical Social Work Note (Signed)
CSW received phone call from [facility] that they have received insurance authorization for patient to go to SNF.  CSW contacted patient's daughter and informed her that insurance has been approved, CSW updated case Freight forwarder and physician.  CSW to continue to follow patient's progress throughout discharge planning.  Jones Broom. Bude, MSW, [place]  12/02/2016 5:22 PM

## 2016-12-02 NOTE — Progress Notes (Signed)
Physical Therapy Treatment Patient Details Name: Kristin Brewer MRN: [id number] DOB: November 07, 1926 Today's Date: 12/02/2016    History of Present Illness Pt. is an 83 y.o. female with a known history of Breast cancer, hypertension, kidney disease, renal insufficiency who lives with her daughter and son in law.  Pt. moved her from [place]. Pt. fell down attempting to use the bathroom. Pt's daughter was not home, therefore pt. stayed on the floor until her daughter returned. Patient denies any loss of consciousness, palpitation, chest pain, shortness of breath, focal weakness with this episode. Daughter helped her to get up and go to the bed where patient stayed for half an hour then again tried to get up and go somewhere and she fell down. Concerned with this daughter brought her to the emergency room. Her urinalysis is negative but she is noted to have elevated CK level and her troponin is also high. So she started on heparin IV drip by ER physician and given to hospitalist service for further management. She is now admitted for acute rhabdomyolysis, fall, and elevated troponin (NSTEMI vs supply/demand ischemia).     PT Comments    Pt performs bed mobility with ModA for side-lying to sit;  Tranfers performed with minA and ambulation with CGA-MinA, requiring more assist initially. Assist secondary to impaired strength, power, balance, and endurance. Pt amb total of 80 ft, requiring 1 rest break after ~25 ft, with c/o dizziness. Pt with cont reports of feeling weak and as if she were going to fall during session; chair follow for safety. Overall, pt responded well to today's treatment with no adverse affects and is progressing towards PT goals. However, based on the previously mentioned impairements, plan remains the samePt would benefit from skilled PT to address the previously mentioned impairments and promote return to PLOF. Currently recommending SNF, pending d/c.     Follow Up Recommendations  SNF      Equipment Recommendations  None recommended by PT;Other (comment)    Recommendations for Other Services       Precautions / Restrictions Precautions Precautions: Fall Restrictions Weight Bearing Restrictions: No    Mobility  Bed Mobility Overal bed mobility: Needs Assistance Bed Mobility: Supine to Sit     Supine to sit: Mod assist     General bed mobility comments: Pt CGA with rolling to the L and mModA with sidelying to sit; requires mod verbal cues for body mechanics and safety awareenss with bed mobility. No reports of dizziness upon sitting.  HOB elevated and utilized L rail with B UE's for support when coming to sit.    Transfers Overall transfer level: Needs assistance Equipment used: Rolling walker (2 wheeled) Transfers: Sit to/from Stand Sit to Stand: Min assist         General transfer comment: Requires increased time and min cues ofor foot/hand placement and safety awareness. Pt unsteady, with forward flexed posture, eventually obtaining full erect posture with cues. BUE support to maintain balance with standing.  Ambulation/Gait Ambulation/Gait assistance: Min assist;Min guard Ambulation Distance (Feet): 80 Feet Assistive device: Rolling walker (2 wheeled) Gait Pattern/deviations: Decreased step length - right;Decreased step length - left;Decreased stride length     General Gait Details: Pt with short stride length and step length, though is demonstrating good reciprical gait pattern. Requires CGA-MinA, with increased assist required during turns and initiation of gait. Chair follow for safety. Pt with one report of dizziness adn rest break after ~25 ft. Verbal and tactile cues for staying inside RW and  maintaining safety.    Stairs            Wheelchair Mobility    Modified Rankin (Stroke Patients Only)       Balance           Standing balance support: Bilateral upper extremity supported Standing balance-Leahy Scale: Fair Standing  balance comment: COnt to have poor initial standing balance that improves over time. Pt able to stand with eyes closed, no UE support and CGA for ~8 sec. Unable to stand in wide based tandem for >2-3 sec without UE support and minA (with UE support, required CGA. unstable with dynamic balance with B UE during turns to R and L, requiring minA.                             Cognition Arousal/Alertness: Awake/alert Behavior During Therapy: WFL for tasks assessed/performed Overall Cognitive Status: History of cognitive impairments - at baseline                                        Exercises Other Exercises Other Exercises: Supine therex perfromed with supervision to B LE's x 10 reps: ankle pumps, SLR, and hip abd. Standing marches performed x 5 to B LE's. Pt required min tactile and verbal cues to acheive and maintain correct mechancis with therex. Able to correctly follow directions ~70% of the time.     General Comments        Pertinent Vitals/Pain Pain Assessment: No/denies pain    Home Living                      Prior Function            PT Goals (current goals can now be found in the care plan section) Acute Rehab PT Goals Patient Stated Goal: To be able to help her daughter more. PT Goal Formulation: With patient Time For Goal Achievement: 12/13/16 Potential to Achieve Goals: Fair Progress towards PT goals: Progressing toward goals    Frequency    Min 2X/week      PT Plan Current plan remains appropriate    Co-evaluation              AM-PAC PT "6 Clicks" Daily Activity  Outcome Measure  Difficulty turning over in bed (including adjusting bedclothes, sheets and blankets)?: A Little Difficulty moving from lying on back to sitting on the side of the bed? : Total Difficulty sitting down on and standing up from a chair with arms (e.g., wheelchair, bedside commode, etc,.)?: Total Help needed moving to and from a bed to chair  (including a wheelchair)?: A Little Help needed walking in hospital room?: A Little Help needed climbing 3-5 steps with a railing? : A Lot 6 Click Score: 13    End of Session Equipment Utilized During Treatment: Gait belt Activity Tolerance: Patient tolerated treatment well Patient left: in chair;with call bell/phone within reach;with chair alarm set Nurse Communication: Mobility status PT Visit Diagnosis: Unsteadiness on feet (R26.81);Repeated falls (R29.6);Muscle weakness (generalized) (M62.81)     Time: 6712-4580 PT Time Calculation (min) (ACUTE ONLY): 18 min  Charges:                       G Codes:       Oran Rein PT, SPT   Mickel Baas  Abbey Chatters 12/02/2016, 11:41 AM

## 2016-12-02 NOTE — Progress Notes (Signed)
[facility] at [place] NAME: Kristin Brewer    MR#:  [id number]  DATE OF BIRTH:  April 26, 1927  SUBJECTIVE:  About same, sitting in chair. Awaiting insurance authorization for d/c to SNF.   REVIEW OF SYSTEMS:    Review of Systems  Unable to perform ROS: Dementia    Nutrition: Heart Healthy Tolerating Diet: Yes Tolerating PT: Evaluation noted.   DRUG ALLERGIES:   Allergies  Allergen Reactions  . Penicillins     Has patient had a PCN reaction causing immediate rash, facial/tongue/throat swelling, SOB or lightheadedness with hypotension: Unknown Has patient had a PCN reaction causing severe rash involving mucus membranes or skin necrosis: Unknown Has patient had a PCN reaction that required hospitalization: No Has patient had a PCN reaction occurring within the last 10 years: No If all of the above answers are "NO", then may proceed with Cephalosporin use.   . Ciprofloxacin Rash    VITALS:  Blood pressure (!) 152/50, pulse 74, temperature 98.1 F (36.7 C), resp. rate 18, height 5' (1.524 m), weight 60.1 kg (132 lb 8 oz), SpO2 97 %.  PHYSICAL EXAMINATION:   Physical Exam  GENERAL:  83 y.o.-year-old patient lying in bed in no acute distress.  EYES: Pupils equal, round, reactive to light and accommodation. No scleral icterus. Extraocular muscles intact.  HEENT: Head atraumatic, normocephalic. Oropharynx and nasopharynx clear.  NECK:  Supple, no jugular venous distention. No thyroid enlargement, no tenderness.  LUNGS: Normal breath sounds bilaterally, no wheezing, rales, rhonchi. No use of accessory muscles of respiration.  CARDIOVASCULAR: S1, S2 normal. No murmurs, rubs, or gallops.  ABDOMEN: Soft, nontender, nondistended. Bowel sounds present. No organomegaly or mass.  EXTREMITIES: No cyanosis, clubbing or edema b/l.    NEUROLOGIC: Cranial nerves II through XII are intact. No focal Motor or sensory deficits b/l.  Globally weak.   PSYCHIATRIC: The patient is alert and oriented x 1.  SKIN: No obvious rash, lesion, or ulcer.  LABORATORY PANEL:   CBC  Recent Labs Lab 11/28/16 0603  WBC 7.2  HGB 13.1  HCT 38.6  PLT 153   ------------------------------------------------------------------------------------------------------------------  Chemistries   Recent Labs Lab 11/28/16 0603 11/29/16 0655  NA 141 141  K 3.5 4.0  CL 113* 112*  CO2 22 24  GLUCOSE 108* 104*  BUN 22* 19  CREATININE 1.02* 0.93  CALCIUM 8.8* 9.0  MG 2.2  --    ------------------------------------------------------------------------------------------------------------------  Cardiac Enzymes  Recent Labs Lab 11/27/16 0436  TROPONINI 4.98*   ------------------------------------------------------------------------------------------------------------------  RADIOLOGY:  No results found.   ASSESSMENT AND PLAN:   83 year old female with past medical history of dementia, chronic kidney disease stage II, hypertension, history of breast cancer who presented to the hospital after she was found down by her daughter and noted to have acute rhabdomyolysis. Patient also ruled in for a non-ST elevation MI.  1. Non-ST elevation MI-patient had ruled in by cardiac markers as her troponin trended upwards as high as 6. She has remained clinically asymptomatic although. -Echo showing normal LV function and no plans for any acute intervention given her advanced dementia and as the patient is asymptomatic. Continue medical management for now. - cont. ASA, Toprol, Atorvastatin, Lisinopril.   2. Acute rhabdomyolysis-secondary to her fall. CK's have trended down and off IV fluids now.    3. History of recurrent falls- Seen by PT and they recommend short-term rehabilitation but awaiting insurance authorization.  4. Hypokalemia- improved and resolved w/ supplementation.  5. Dementia-continue Aricept.  6. Depression-continue Lexapro.  7. History  of urinary incontinence-continue Toviaz.  Discharge to skilled nursing facility once insurance authorization obtained. Likely D/C tomorrow.  Received Insurance Auth at the end of day today  All the records are reviewed and case discussed with Care Management/Social Worker. Management plans discussed with the patient, nursing and they are in agreement.  CODE STATUS: DNR  DVT Prophylaxis: Hep. gtt  TOTAL TIME TAKING CARE OF THIS PATIENT: 15 minutes.   POSSIBLE D/C tomorrow, DEPENDING ON CLINICAL CONDITION.   Max Sane M.D on 12/02/2016 at 7:21 PM  Between 7am to 6pm - Pager - [phone]  After 6pm go to [email or web address] - Proofreader  [facility]  Office  [phone]  CC: Primary care physician; Idelle Crouch, MD

## 2016-12-02 NOTE — Care Management Important Message (Signed)
Important Message  Patient Details  Name: Kristin Brewer MRN: [id number] Date of Birth: 1926-06-19   Medicare Important Message Given:  Yes    Katrina Stack, RN 12/02/2016, 12:03 PM

## 2016-12-03 MED ORDER — LISINOPRIL 5 MG PO TABS: 5.0000 mg | ORAL_TABLET | Freq: Every day | ORAL | 0 refills | Status: DC

## 2016-12-03 MED ORDER — ATORVASTATIN CALCIUM 40 MG PO TABS: 40.0000 mg | ORAL_TABLET | Freq: Every day | ORAL | 0 refills | Status: DC

## 2016-12-03 MED ORDER — LORAZEPAM 1 MG PO TABS: 1.0000 mg | ORAL_TABLET | Freq: Every evening | ORAL | 0 refills | Status: AC | PRN

## 2016-12-03 NOTE — Plan of Care (Signed)
Problem: Health Behavior/Discharge Planning: Goal: Ability to manage health-related needs will improve Outcome: Completed/Met Date Met: 12/03/16 Discharge information given to patient.  Report called to [facility].  2nd copy of AVS included with info sent to [facility].

## 2016-12-03 NOTE — Plan of Care (Signed)
Problem: Activity: Goal: Risk for activity intolerance will decrease Outcome: Progressing Up to bedside commode without pain or shortness of breath.  No distress noted.  Remains pleasantly confused.

## 2016-12-03 NOTE — Discharge Instructions (Signed)
Heart Attack A heart attack (myocardial infarction, MI) causes damage to the heart that cannot be fixed. A heart attack often happens when a blood clot or other blockage cuts blood flow to the heart. When this happens, certain areas of the heart begin to die. This causes the pain you feel during a heart attack. Follow these instructions at home:  Take medicine as told by your doctor. You may need medicine to: ? Keep your blood from clotting too easily. ? Control your blood pressure. ? Lower your cholesterol. ? Control abnormal heart rhythms.  Change certain behaviors as told by your doctor. This may include: ? Quitting smoking. ? Being active. ? Eating a heart-healthy diet. Ask your doctor for help with this diet. ? Keeping a healthy weight. ? Keeping your diabetes under control. ? Lessening stress. ? Limiting how much alcohol you drink. Do not take these medicines unless your doctor says that you can:  Nonsteroidal anti-inflammatory drugs (NSAIDs). These include: ? Ibuprofen. ? Naproxen. ? Celecoxib.  Vitamin supplements that have vitamin A, vitamin E, or both.  Hormone therapy that contains estrogen with or without progestin.  Get help right away if:  You have sudden chest discomfort.  You have sudden discomfort in your: ? Arms. ? Back. ? Neck. ? Jaw.  You have shortness of breath at any time.  You have sudden sweating or clammy skin.  You feel sick to your stomach (nauseous) or throw up (vomit).  You suddenly get light-headed or dizzy.  You feel your heart beating fast or skipping beats. These symptoms may be an emergency. Do not wait to see if the symptoms will go away. Get medical help right away. Call your local emergency services (911 in the [place] Do not drive yourself to the hospital. This information is not intended to replace advice given to you by your health care provider. Make sure you discuss any questions you have with your health care  provider. Document Released: 10/29/2011 Document Revised: 10/05/2015 Document Reviewed: 07/02/2013 Elsevier Interactive Patient Education  2017 Elsevier Inc.  

## 2016-12-03 NOTE — Clinical Social Work Note (Addendum)
Patient to be d/c'ed today to [facility].  Patient and family agreeable to plans will transport via ems RN to call report to room [phone].  CSW left message on patient's daughter's phone to inform her that patient will be discharging today.  Evette Cristal, MSW, [place]

## 2016-12-03 NOTE — Discharge Summary (Addendum)
[place] [facility] [place] NAME: Kristin Brewer    MR#:  [id number]  DATE OF BIRTH:  04-08-1927  DATE OF ADMISSION:  11/26/2016   ADMITTING PHYSICIAN: Vaughan Basta, MD  DATE OF DISCHARGE: 12/03/2016  PRIMARY CARE PHYSICIAN: Idelle Crouch, MD   ADMISSION DIAGNOSIS:  NSTEMI (non-ST elevated myocardial infarction) ([place]) [I21.4] Fall, initial encounter [W19.XXXA] Traumatic rhabdomyolysis, initial encounter ([place]) [T79.6XXA] DISCHARGE DIAGNOSIS:  Principal Problem:   Rhabdomyolysis Active Problems:   Elevated troponin   Fall   Dementia   Hypokalemia   Leukocytosis   CKD (chronic kidney disease), stage II  SECONDARY DIAGNOSIS:   Past Medical History:  Diagnosis Date  . Breast cancer ([place])   . CKD (chronic kidney disease), stage II   . Dementia   . Frequent falls   . Hypertension    HOSPITAL COURSE:  83 year old female with past medical history of dementia, chronic kidney disease stage II, hypertension, history of breast cancer who presented to the hospital after she was found down by her daughter and noted to have acute rhabdomyolysis.   1.  Elevated troponin, likely demand ischemia in the setting of rhabdomyolysis. This is not NSTEMI and is ruled out. -Echo showing normal LV function and no plans for any acute intervention given her advanced dementia and as the patient is asymptomatic. Continue medical management for now. - cont. ASA, Toprol, Atorvastatin, Lisinopril.   2. Acute rhabdomyolysis-secondary to her fall. CK's have trended down and has normalized.  3. History of recurrent falls- Seen by PT and they recommend short-term rehabilitation , where she is being discharged  4. Hypokalemia- improved and resolved w/ supplementation.   5. Dementia-continue Aricept.  6. Depression-continue Lexapro.  7. History of urinary incontinence-continue Toviaz.  Discharge to skilled nursing facility today.  Has overall  poor prognosis. DISCHARGE CONDITIONS:  stable CONSULTS OBTAINED:   DRUG ALLERGIES:   Allergies  Allergen Reactions  . Penicillins     Has patient had a PCN reaction causing immediate rash, facial/tongue/throat swelling, SOB or lightheadedness with hypotension: Unknown Has patient had a PCN reaction causing severe rash involving mucus membranes or skin necrosis: Unknown Has patient had a PCN reaction that required hospitalization: No Has patient had a PCN reaction occurring within the last 10 years: No If all of the above answers are "NO", then may proceed with Cephalosporin use.   . Ciprofloxacin Rash   DISCHARGE MEDICATIONS:   Allergies as of 12/03/2016      Reactions   Penicillins    Has patient had a PCN reaction causing immediate rash, facial/tongue/throat swelling, SOB or lightheadedness with hypotension: Unknown Has patient had a PCN reaction causing severe rash involving mucus membranes or skin necrosis: Unknown Has patient had a PCN reaction that required hospitalization: No Has patient had a PCN reaction occurring within the last 10 years: No If all of the above answers are "NO", then may proceed with Cephalosporin use.   Ciprofloxacin Rash      Medication List    STOP taking these medications   HYDROcodone-acetaminophen 5-325 MG tablet Commonly known as:  NORCO/VICODIN     TAKE these medications   amLODipine 5 MG tablet Commonly known as:  NORVASC Take 5 mg by mouth daily.   aspirin 81 MG EC tablet Take 81 mg by mouth daily.   atorvastatin 40 MG tablet Commonly known as:  LIPITOR Take 1 tablet (40 mg total) by mouth daily at 6 PM.   CRANBERRY PO  Take 2 tablets by mouth daily.   donepezil 5 MG tablet Commonly known as:  ARICEPT Take 5 mg by mouth at bedtime.   escitalopram 10 MG tablet Commonly known as:  LEXAPRO Take 10 mg by mouth daily.   indapamide 2.5 MG tablet Commonly known as:  LOZOL TAKE 1 TABLET (2.5 MG TOTAL) BY MOUTH ONCE DAILY.     KLOR-CON 10 10 MEQ tablet Generic drug:  potassium chloride Take 20 mEq by mouth daily.   lisinopril 5 MG tablet Commonly known as:  PRINIVIL,ZESTRIL Take 1 tablet (5 mg total) by mouth daily.   LORazepam 1 MG tablet Commonly known as:  ATIVAN Take 1 tablet (1 mg total) by mouth at bedtime as needed. What changed:  See the new instructions.   magnesium oxide 400 (241.3 Mg) MG tablet Commonly known as:  MAG-OX Take 1 tablet by mouth daily.   metoprolol succinate 100 MG 24 hr tablet Commonly known as:  TOPROL-XL TAKE 1 TABLET (100 MG TOTAL) BY MOUTH ONCE DAILY.   ranitidine 150 MG tablet Commonly known as:  ZANTAC TAKE 1 TABLET (150 MG TOTAL) BY MOUTH 2 (TWO) TIMES DAILY.   tolterodine 4 MG 24 hr capsule Commonly known as:  DETROL LA Take 4 mg by mouth daily.   Vitamin D (Ergocalciferol) 50000 units Caps capsule Commonly known as:  DRISDOL TAKE 1 CAPSULE BY MOUTH EVERY MONTH      DISCHARGE INSTRUCTIONS:   DIET:  Cardiac diet DISCHARGE CONDITION:  Fair ACTIVITY:  Activity as tolerated OXYGEN:  Home Oxygen: No.  Oxygen Delivery: room air DISCHARGE LOCATION:  nursing home - palliative care evaluation While at the facility  If you experience worsening of your admission symptoms, develop shortness of breath, life threatening emergency, suicidal or homicidal thoughts you must seek medical attention immediately by calling 911 or calling your MD immediately  if symptoms less severe.  You Must read complete instructions/literature along with all the possible adverse reactions/side effects for all the Medicines you take and that have been prescribed to you. Take any new Medicines after you have completely understood and accpet all the possible adverse reactions/side effects.   Please note  You were cared for by a hospitalist during your hospital stay. If you have any questions about your discharge medications or the care you received while you were in the hospital after  you are discharged, you can call the unit and asked to speak with the hospitalist on call if the hospitalist that took care of you is not available. Once you are discharged, your primary care physician will handle any further medical issues. Please note that NO REFILLS for any discharge medications will be authorized once you are discharged, as it is imperative that you return to your primary care physician (or establish a relationship with a primary care physician if you do not have one) for your aftercare needs so that they can reassess your need for medications and monitor your lab values.    On the day of Discharge:  VITAL SIGNS:  Blood pressure (!) 142/37, pulse (!) 51, temperature (!) 97.5 F (36.4 C), temperature source Oral, resp. rate 17, height 5' (1.524 m), weight 60.1 kg (132 lb 8 oz), SpO2 100 %. PHYSICAL EXAMINATION:  GENERAL:  83 y.o.-year-old patient lying in the bed with no acute distress.  EYES: Pupils equal, round, reactive to light and accommodation. No scleral icterus. Extraocular muscles intact.  HEENT: Head atraumatic, normocephalic. Oropharynx and nasopharynx clear.  NECK:  Supple, no  jugular venous distention. No thyroid enlargement, no tenderness.  LUNGS: Normal breath sounds bilaterally, no wheezing, rales,rhonchi or crepitation. No use of accessory muscles of respiration.  CARDIOVASCULAR: S1, S2 normal. No murmurs, rubs, or gallops.  ABDOMEN: Soft, non-tender, non-distended. Bowel sounds present. No organomegaly or mass.  EXTREMITIES: No pedal edema, cyanosis, or clubbing.  NEUROLOGIC: Cranial nerves II through XII are intact. Muscle strength 5/5 in all extremities. Sensation intact. Gait not checked.  PSYCHIATRIC: The patient is alert and oriented x 3.  SKIN: No obvious rash, lesion, or ulcer.  DATA REVIEW:   CBC  Recent Labs Lab 11/28/16 0603  WBC 7.2  HGB 13.1  HCT 38.6  PLT 153    Chemistries   Recent Labs Lab 11/28/16 0603 11/29/16 0655  NA 141  141  K 3.5 4.0  CL 113* 112*  CO2 22 24  GLUCOSE 108* 104*  BUN 22* 19  CREATININE 1.02* 0.93  CALCIUM 8.8* 9.0  MG 2.2  --      Contact information for follow-up providers    Idelle Crouch, MD. Schedule an appointment as soon as possible for a visit in 1 week(s).   Specialty:  Internal Medicine Contact information: [place] [address] [phone]            Contact information for after-discharge care    Destination    [facility] Follow up.   Specialty:  [place] information: [address] [place] [phone]                  Management plans discussed with the patient, family and they are in agreement.  CODE STATUS: DNR palliative care evaluation.  While at the facility  [place] THIS PATIENT: 45 minutes.    Max Sane M.D on 12/03/2016 at 7:57 AM  Between 7am to 6pm - Pager - [phone]  After 6pm go to [email or web address] - Proofreader  [facility]  Office  [phone]  CC: Primary care physician; Idelle Crouch, MD   Note: This dictation was prepared with Dragon dictation along with smaller phrase technology. Any transcriptional errors that result from this process are unintentional.

## 2016-12-03 NOTE — Progress Notes (Signed)
Patient is being discharged to [facility] when EMS arrives.  Report has been called to [facility].  A copy of the AVS is included in the discharge packet.  A patient copy was reviewed with the patient and placed in her suitcase.  I will attempt to call her daughter to let her know that it is there.

## 2016-12-19 ENCOUNTER — Encounter (INDEPENDENT_AMBULATORY_CARE_PROVIDER_SITE_OTHER): Payer: Self-pay

## 2016-12-19 ENCOUNTER — Ambulatory Visit (INDEPENDENT_AMBULATORY_CARE_PROVIDER_SITE_OTHER): Payer: Self-pay | Admitting: Vascular Surgery

## 2016-12-27 ENCOUNTER — Encounter (INDEPENDENT_AMBULATORY_CARE_PROVIDER_SITE_OTHER): Payer: Medicare HMO

## 2016-12-27 ENCOUNTER — Ambulatory Visit (INDEPENDENT_AMBULATORY_CARE_PROVIDER_SITE_OTHER): Payer: Medicare HMO | Admitting: Vascular Surgery

## 2017-03-20 ENCOUNTER — Other Ambulatory Visit (INDEPENDENT_AMBULATORY_CARE_PROVIDER_SITE_OTHER): Payer: Self-pay | Admitting: Vascular Surgery

## 2017-03-20 DIAGNOSIS — I6523 Occlusion and stenosis of bilateral carotid arteries: Secondary | ICD-10-CM

## 2017-03-21 ENCOUNTER — Ambulatory Visit (INDEPENDENT_AMBULATORY_CARE_PROVIDER_SITE_OTHER): Payer: Medicare HMO | Admitting: Vascular Surgery

## 2017-03-21 ENCOUNTER — Encounter (INDEPENDENT_AMBULATORY_CARE_PROVIDER_SITE_OTHER): Payer: Medicare HMO

## 2017-04-28 ENCOUNTER — Emergency Department: Payer: Medicare HMO

## 2017-04-28 ENCOUNTER — Other Ambulatory Visit: Payer: Self-pay

## 2017-04-28 ENCOUNTER — Inpatient Hospital Stay: Payer: Medicare HMO

## 2017-04-28 ENCOUNTER — Encounter: Payer: Self-pay | Admitting: Emergency Medicine

## 2017-04-28 ENCOUNTER — Inpatient Hospital Stay
Admission: EM | Admit: 2017-04-28 | Discharge: 2017-05-02 | DRG: 690 | Disposition: A | Discharge status: Home Health | Payer: Medicare HMO | Attending: Internal Medicine | Admitting: Internal Medicine

## 2017-04-28 DIAGNOSIS — I350 Nonrheumatic aortic (valve) stenosis: Secondary | ICD-10-CM | POA: Diagnosis present

## 2017-04-28 DIAGNOSIS — N182 Chronic kidney disease, stage 2 (mild): Secondary | ICD-10-CM | POA: Diagnosis present

## 2017-04-28 DIAGNOSIS — F329 Major depressive disorder, single episode, unspecified: Secondary | ICD-10-CM | POA: Diagnosis present

## 2017-04-28 DIAGNOSIS — N3000 Acute cystitis without hematuria: Principal | ICD-10-CM | POA: Diagnosis present

## 2017-04-28 DIAGNOSIS — Z8249 Family history of ischemic heart disease and other diseases of the circulatory system: Secondary | ICD-10-CM

## 2017-04-28 DIAGNOSIS — Z881 Allergy status to other antibiotic agents status: Secondary | ICD-10-CM | POA: Diagnosis not present

## 2017-04-28 DIAGNOSIS — Z853 Personal history of malignant neoplasm of breast: Secondary | ICD-10-CM | POA: Diagnosis not present

## 2017-04-28 DIAGNOSIS — M25562 Pain in left knee: Secondary | ICD-10-CM | POA: Diagnosis present

## 2017-04-28 DIAGNOSIS — Z79891 Long term (current) use of opiate analgesic: Secondary | ICD-10-CM | POA: Diagnosis not present

## 2017-04-28 DIAGNOSIS — G9340 Encephalopathy, unspecified: Secondary | ICD-10-CM | POA: Diagnosis present

## 2017-04-28 DIAGNOSIS — Z7982 Long term (current) use of aspirin: Secondary | ICD-10-CM | POA: Diagnosis not present

## 2017-04-28 DIAGNOSIS — F0390 Unspecified dementia without behavioral disturbance: Secondary | ICD-10-CM | POA: Diagnosis present

## 2017-04-28 DIAGNOSIS — E860 Dehydration: Secondary | ICD-10-CM | POA: Diagnosis not present

## 2017-04-28 DIAGNOSIS — I129 Hypertensive chronic kidney disease with stage 1 through stage 4 chronic kidney disease, or unspecified chronic kidney disease: Secondary | ICD-10-CM | POA: Diagnosis present

## 2017-04-28 DIAGNOSIS — M1990 Unspecified osteoarthritis, unspecified site: Secondary | ICD-10-CM | POA: Diagnosis present

## 2017-04-28 DIAGNOSIS — I739 Peripheral vascular disease, unspecified: Secondary | ICD-10-CM | POA: Diagnosis present

## 2017-04-28 DIAGNOSIS — R7989 Other specified abnormal findings of blood chemistry: Secondary | ICD-10-CM | POA: Diagnosis present

## 2017-04-28 DIAGNOSIS — R749 Abnormal serum enzyme level, unspecified: Secondary | ICD-10-CM | POA: Diagnosis present

## 2017-04-28 DIAGNOSIS — K5900 Constipation, unspecified: Secondary | ICD-10-CM | POA: Diagnosis present

## 2017-04-28 DIAGNOSIS — Z96653 Presence of artificial knee joint, bilateral: Secondary | ICD-10-CM | POA: Diagnosis present

## 2017-04-28 DIAGNOSIS — I700 Atherosclerosis of aorta: Secondary | ICD-10-CM | POA: Diagnosis present

## 2017-04-28 DIAGNOSIS — Z9049 Acquired absence of other specified parts of digestive tract: Secondary | ICD-10-CM

## 2017-04-28 DIAGNOSIS — Z880 Allergy status to penicillin: Secondary | ICD-10-CM

## 2017-04-28 DIAGNOSIS — F05 Delirium due to known physiological condition: Secondary | ICD-10-CM | POA: Diagnosis present

## 2017-04-28 DIAGNOSIS — E876 Hypokalemia: Secondary | ICD-10-CM | POA: Diagnosis present

## 2017-04-28 DIAGNOSIS — W19XXXA Unspecified fall, initial encounter: Secondary | ICD-10-CM | POA: Diagnosis present

## 2017-04-28 DIAGNOSIS — R296 Repeated falls: Secondary | ICD-10-CM | POA: Diagnosis present

## 2017-04-28 DIAGNOSIS — M25561 Pain in right knee: Secondary | ICD-10-CM | POA: Diagnosis present

## 2017-04-28 DIAGNOSIS — N179 Acute kidney failure, unspecified: Secondary | ICD-10-CM | POA: Diagnosis present

## 2017-04-28 DIAGNOSIS — Y92009 Unspecified place in unspecified non-institutional (private) residence as the place of occurrence of the external cause: Secondary | ICD-10-CM | POA: Diagnosis not present

## 2017-04-28 DIAGNOSIS — R52 Pain, unspecified: Secondary | ICD-10-CM

## 2017-04-28 DIAGNOSIS — Z79899 Other long term (current) drug therapy: Secondary | ICD-10-CM

## 2017-04-28 DIAGNOSIS — I248 Other forms of acute ischemic heart disease: Secondary | ICD-10-CM | POA: Diagnosis present

## 2017-04-28 DIAGNOSIS — Z66 Do not resuscitate: Secondary | ICD-10-CM | POA: Diagnosis present

## 2017-04-28 HISTORY — DX: Nonrheumatic aortic (valve) stenosis: I35.0

## 2017-04-28 LAB — URINALYSIS, COMPLETE (UACMP) WITH MICROSCOPIC
Bilirubin Urine: NEGATIVE
GLUCOSE, UA: NEGATIVE mg/dL
HGB URINE DIPSTICK: NEGATIVE
Ketones, ur: NEGATIVE mg/dL
Nitrite: NEGATIVE
PH: 5 (ref 5.0–8.0)
PROTEIN: 100 mg/dL — AB
Specific Gravity, Urine: 1.016 (ref 1.005–1.030)

## 2017-04-28 LAB — CBC
HEMATOCRIT: 46.8 % (ref 35.0–47.0)
HEMOGLOBIN: 15.7 g/dL (ref 12.0–16.0)
MCH: 29.9 pg (ref 26.0–34.0)
MCHC: 33.5 g/dL (ref 32.0–36.0)
MCV: 89.2 fL (ref 80.0–100.0)
Platelets: 215 10*3/uL (ref 150–440)
RBC: 5.24 MIL/uL — ABNORMAL HIGH (ref 3.80–5.20)
RDW: 13.5 % (ref 11.5–14.5)
WBC: 12.9 10*3/uL — ABNORMAL HIGH (ref 3.6–11.0)

## 2017-04-28 LAB — BASIC METABOLIC PANEL
Anion gap: 13 (ref 5–15)
BUN: 32 mg/dL — AB (ref 6–20)
CHLORIDE: 104 mmol/L (ref 101–111)
CO2: 23 mmol/L (ref 22–32)
Calcium: 10.4 mg/dL — ABNORMAL HIGH (ref 8.9–10.3)
Creatinine, Ser: 1.43 mg/dL — ABNORMAL HIGH (ref 0.44–1.00)
GFR calc Af Amer: 37 mL/min — ABNORMAL LOW (ref >60)
GFR calc non Af Amer: 32 mL/min — ABNORMAL LOW (ref >60)
GLUCOSE: 139 mg/dL — AB (ref 65–99)
POTASSIUM: 3.5 mmol/L (ref 3.5–5.1)
Sodium: 140 mmol/L (ref 135–145)

## 2017-04-28 LAB — INFLUENZA PANEL BY PCR (TYPE A & B)
Influenza A By PCR: NEGATIVE
Influenza B By PCR: NEGATIVE

## 2017-04-28 LAB — LIPASE, BLOOD: Lipase: 70 U/L — ABNORMAL HIGH (ref 11–51)

## 2017-04-28 LAB — TSH: TSH: 0.845 u[IU]/mL (ref 0.350–4.500)

## 2017-04-28 LAB — CK: CK TOTAL: 66 U/L (ref 38–234)

## 2017-04-28 LAB — TROPONIN I
TROPONIN I: 0.06 ng/mL — AB (ref <0.03)
Troponin I: 0.03 ng/mL (ref <0.03)
Troponin I: 0.06 ng/mL (ref <0.03)

## 2017-04-28 MED ORDER — ENOXAPARIN SODIUM 30 MG/0.3ML ~~LOC~~ SOLN
30.0000 mg | SUBCUTANEOUS | Status: DC
  Administered 2017-04-28 – 2017-04-30 (×2): 30 mg via SUBCUTANEOUS
  Filled 2017-04-28 (×2): qty 0.3

## 2017-04-28 MED ORDER — MAGNESIUM OXIDE 400 (241.3 MG) MG PO TABS
400.0000 mg | ORAL_TABLET | Freq: Every day | ORAL | Status: DC
  Administered 2017-04-28 – 2017-05-02 (×5): 400 mg via ORAL
  Filled 2017-04-28 (×5): qty 1

## 2017-04-28 MED ORDER — FAMOTIDINE 20 MG PO TABS
20.0000 mg | ORAL_TABLET | Freq: Two times a day (BID) | ORAL | Status: DC
  Administered 2017-04-28 – 2017-04-29 (×3): 20 mg via ORAL
  Filled 2017-04-28 (×3): qty 1

## 2017-04-28 MED ORDER — SODIUM CHLORIDE 0.9 % IV SOLN
INTRAVENOUS | Status: DC
  Administered 2017-04-28 – 2017-04-29 (×3): via INTRAVENOUS

## 2017-04-28 MED ORDER — ACETAMINOPHEN 650 MG RE SUPP: 650.0000 mg | Freq: Four times a day (QID) | RECTAL | Status: DC | PRN

## 2017-04-28 MED ORDER — ESCITALOPRAM OXALATE 10 MG PO TABS
10.0000 mg | ORAL_TABLET | Freq: Every day | ORAL | Status: DC
  Administered 2017-04-28 – 2017-05-01 (×4): 10 mg via ORAL
  Filled 2017-04-28 (×4): qty 1

## 2017-04-28 MED ORDER — LISINOPRIL 5 MG PO TABS
5.0000 mg | ORAL_TABLET | Freq: Every day | ORAL | Status: DC
  Administered 2017-04-29: 5 mg via ORAL
  Filled 2017-04-28 (×5): qty 1

## 2017-04-28 MED ORDER — CEFTRIAXONE SODIUM IN DEXTROSE 20 MG/ML IV SOLN
1.0000 g | Freq: Once | INTRAVENOUS | Status: AC
  Administered 2017-04-28: 1 g via INTRAVENOUS
  Filled 2017-04-28: qty 50

## 2017-04-28 MED ORDER — LORAZEPAM 1 MG PO TABS
1.0000 mg | ORAL_TABLET | Freq: Every evening | ORAL | Status: DC | PRN
  Administered 2017-04-28: 1 mg via ORAL
  Filled 2017-04-28 (×2): qty 1

## 2017-04-28 MED ORDER — METOPROLOL SUCCINATE ER 100 MG PO TB24
100.0000 mg | ORAL_TABLET | Freq: Every day | ORAL | Status: DC
  Administered 2017-04-28 – 2017-04-29 (×2): 100 mg via ORAL
  Filled 2017-04-28 (×5): qty 1

## 2017-04-28 MED ORDER — ASPIRIN EC 81 MG PO TBEC
81.0000 mg | DELAYED_RELEASE_TABLET | Freq: Every day | ORAL | Status: DC
  Administered 2017-04-28 – 2017-05-02 (×5): 81 mg via ORAL
  Filled 2017-04-28 (×5): qty 1

## 2017-04-28 MED ORDER — DONEPEZIL HCL 5 MG PO TABS
5.0000 mg | ORAL_TABLET | Freq: Every day | ORAL | Status: DC
Start: 2017-04-28 — End: 2017-05-01
  Administered 2017-04-28 – 2017-04-30 (×2): 5 mg via ORAL
  Filled 2017-04-28 (×4): qty 1

## 2017-04-28 MED ORDER — HYDROCODONE-ACETAMINOPHEN 5-325 MG PO TABS
1.0000 | ORAL_TABLET | Freq: Four times a day (QID) | ORAL | Status: DC | PRN
  Administered 2017-04-28 – 2017-05-01 (×4): 1 via ORAL
  Filled 2017-04-28 (×4): qty 1

## 2017-04-28 MED ORDER — ATORVASTATIN CALCIUM 20 MG PO TABS
40.0000 mg | ORAL_TABLET | Freq: Every day | ORAL | Status: DC
  Administered 2017-04-29 – 2017-05-01 (×3): 40 mg via ORAL
  Filled 2017-04-28 (×4): qty 2

## 2017-04-28 MED ORDER — DEXTROSE 5 % IV SOLN
1.0000 g | INTRAVENOUS | Status: DC
  Administered 2017-04-29 – 2017-05-02 (×4): 1 g via INTRAVENOUS
  Filled 2017-04-28 (×4): qty 10

## 2017-04-28 MED ORDER — ONDANSETRON HCL 4 MG PO TABS: 4.0000 mg | ORAL_TABLET | Freq: Four times a day (QID) | ORAL | Status: DC | PRN

## 2017-04-28 MED ORDER — IOPAMIDOL (ISOVUE-300) INJECTION 61%
15.0000 mL | INTRAVENOUS | Status: AC
  Administered 2017-04-28 (×2): 15 mL via ORAL

## 2017-04-28 MED ORDER — FESOTERODINE FUMARATE ER 4 MG PO TB24
8.0000 mg | ORAL_TABLET | Freq: Every day | ORAL | Status: DC
  Administered 2017-04-28 – 2017-05-02 (×5): 8 mg via ORAL
  Filled 2017-04-28 (×5): qty 2

## 2017-04-28 MED ORDER — ONDANSETRON HCL 4 MG/2ML IJ SOLN
4.0000 mg | Freq: Four times a day (QID) | INTRAMUSCULAR | Status: DC | PRN
  Administered 2017-04-28: 4 mg via INTRAVENOUS
  Filled 2017-04-28: qty 2

## 2017-04-28 MED ORDER — ACETAMINOPHEN 325 MG PO TABS
650.0000 mg | ORAL_TABLET | Freq: Four times a day (QID) | ORAL | Status: DC | PRN
  Administered 2017-04-30 – 2017-05-01 (×2): 650 mg via ORAL
  Filled 2017-04-28 (×5): qty 2

## 2017-04-28 MED ORDER — SERTRALINE HCL 50 MG PO TABS
50.0000 mg | ORAL_TABLET | ORAL | Status: DC
  Administered 2017-04-28 – 2017-04-30 (×2): 50 mg via ORAL
  Filled 2017-04-28 (×3): qty 1

## 2017-04-28 MED ORDER — AMLODIPINE BESYLATE 5 MG PO TABS
5.0000 mg | ORAL_TABLET | Freq: Every day | ORAL | Status: DC
  Administered 2017-04-28 – 2017-04-29 (×2): 5 mg via ORAL
  Filled 2017-04-28 (×3): qty 1

## 2017-04-28 NOTE — ED Provider Notes (Signed)
[facility] Emergency Department Provider Note    First MD Initiated Contact with Patient 04/28/17 [phone]     (approximate)  I have reviewed the triage vital signs and the nursing notes.   HISTORY  Chief Complaint Fall    HPI Kristin Brewer is a 83 y.o. female with below list of chronic medical conditions presents to the emergency department with history of fall this morning.  Patient states that she has been feeling ill for the past 3 days with very poor p.o. intake.  Patient states on awakening this morning and attempting to go to the bathroom upon standing she became acutely dizzy and subsequent he fell with bilateral knee pain   Past Medical History:  Diagnosis Date  . Aortic stenosis   . Breast cancer ([place])   . CKD (chronic kidney disease), stage II   . Dementia   . Frequent falls   . Hypertension     Patient Active Problem List   Diagnosis Date Noted  . Encephalopathy 04/28/2017  . Fall 11/27/2016  . Dementia 11/27/2016  . Hypokalemia 11/27/2016  . Leukocytosis 11/27/2016  . CKD (chronic kidney disease), stage II 11/27/2016  . Elevated troponin 11/26/2016  . Rhabdomyolysis 11/26/2016  . Essential hypertension, benign 06/14/2016  . Carotid artery disease ([place]) 06/14/2016  . Aortic atherosclerosis ([place]) 06/14/2016  . PVD (peripheral vascular disease) ([place]) 06/14/2016    Past Surgical History:  Procedure Laterality Date  . BREAST LUMPECTOMY    . CHOLECYSTECTOMY    . KNEE SURGERY Bilateral   . THYROIDECTOMY      Prior to Admission medications   Medication Sig Start Date End Date Taking? Authorizing Provider  amLODipine (NORVASC) 5 MG tablet Take 5 mg by mouth daily.  07/21/15  Yes [provider]  aspirin 81 MG EC tablet Take 81 mg by mouth daily. 03/31/16  Yes [provider]  CRANBERRY PO Take 2 tablets by mouth daily.   Yes [provider]  docusate sodium (COLACE) 100 MG capsule Take 100 mg by mouth daily  as needed for mild constipation.   Yes [provider]  donepezil (ARICEPT) 5 MG tablet Take 5 mg by mouth at bedtime.  02/12/16 04/28/17 Yes [provider]  HYDROcodone-acetaminophen (NORCO/VICODIN) 5-325 MG tablet Take 1 tablet by mouth every 6 (six) hours as needed. for pain 03/19/17  Yes [provider]  indapamide (LOZOL) 2.5 MG tablet TAKE 1 TABLET (2.5 MG TOTAL) BY MOUTH ONCE DAILY. 03/31/16  Yes [provider]  lisinopril (PRINIVIL,ZESTRIL) 10 MG tablet Take 10 mg by mouth daily.   Yes [provider]  LORazepam (ATIVAN) 1 MG tablet Take 1 tablet (1 mg total) by mouth at bedtime as needed. 12/03/16  Yes Max Sane, MD  magnesium oxide (MAG-OX) 400 (241.3 Mg) MG tablet Take 1 tablet by mouth daily. 03/25/16  Yes [provider]  metoprolol succinate (TOPROL-XL) 100 MG 24 hr tablet TAKE 1 TABLET (100 MG TOTAL) BY MOUTH ONCE DAILY. 05/31/16  Yes [provider]  potassium chloride SA (K-DUR,KLOR-CON) 20 MEQ tablet Take 1 tablet by mouth daily. 09/26/16 09/26/17 Yes [provider]  ranitidine (ZANTAC) 150 MG tablet TAKE 1 TABLET (150 MG TOTAL) BY MOUTH 2 (TWO) TIMES DAILY. 03/31/16  Yes [provider]  sertraline (ZOLOFT) 50 MG tablet Take 1 tablet by mouth at bedtime.  04/02/17  Yes [provider]  tolterodine (DETROL LA) 4 MG 24 hr capsule Take 4 mg by mouth daily. 05/31/16  Yes [provider]  Vitamin D, Ergocalciferol, (DRISDOL) 50000 units CAPS capsule TAKE 1 CAPSULE BY MOUTH EVERY MONTH 06/01/16  Yes [provider]  atorvastatin (LIPITOR) 40 MG tablet Take 1 tablet (40 mg total) by mouth daily at 6 PM. Patient not taking: Reported on 04/28/2017 12/03/16   Max Sane, MD  escitalopram (LEXAPRO) 10 MG tablet Take 10 mg by mouth daily.    [provider]    Allergies Penicillins and Ciprofloxacin  Family History  Problem Relation Age of Onset  . CAD Mother   . CAD Father      Social History Social History   Tobacco Use  . Smoking status: Never Smoker  . Smokeless tobacco: Never Used  Substance Use Topics  . Alcohol use: Yes  . Drug use: No    Review of Systems Constitutional: No fever/chills Eyes: No visual changes. ENT: No sore throat. Cardiovascular: Denies chest pain. Respiratory: Denies shortness of breath. Gastrointestinal: No abdominal pain.  No nausea, no vomiting.  No diarrhea.  No constipation. Genitourinary: Negative for dysuria. Musculoskeletal: Negative for neck pain.  Negative for back pain.  Positive for bilateral knee pain. Integumentary: Negative for rash. Neurological: Negative for headaches, focal weakness or numbness.   ____________________________________________   PHYSICAL EXAM:  VITAL SIGNS: ED Triage Vitals  Enc Vitals Group     BP 04/28/17 0657 (!) 177/57     Pulse Rate 04/28/17 0657 65     Resp 04/28/17 0657 18     Temp 04/28/17 0657 97.6 F (36.4 C)     Temp Source 04/28/17 0657 Oral     SpO2 04/28/17 0657 99 %     Weight 04/28/17 0657 54.9 kg (121 lb)     Height 04/28/17 0657 1.549 m (5\' 1" )     Head Circumference --      Peak Flow --      Pain Score 04/28/17 0656 7     Pain Loc --      Pain Edu? --      Excl. in [place]? --     Constitutional: Alert and oriented. Well appearing and in no acute distress. Eyes: Conjunctivae are normal.  Head: Atraumatic. Mouth/Throat: Mucous membranes are moist.  Oropharynx non-erythematous. Neck: No stridor.   Cardiovascular: Normal rate, regular rhythm. Good peripheral circulation. Grossly normal heart sounds. Respiratory: Normal respiratory effort.  No retractions. Lungs CTAB. Gastrointestinal: Right upper right lower and left lower quadrant tenderness to palpation. No distention.  Musculoskeletal: No lower extremity tenderness nor edema. No gross deformities of extremities. Neurologic:  Normal speech and language. No gross focal neurologic deficits are appreciated.    Skin:  Skin is warm, dry and intact. No rash noted. Psychiatric: Mood and affect are normal. Speech and behavior are normal.  ____________________________________________   LABS (all labs ordered are listed, but only abnormal results are displayed)  Labs Reviewed  BASIC METABOLIC PANEL - Abnormal; Notable for the following components:      Result Value   Glucose, Bld 139 (*)    BUN 32 (*)    Creatinine, Ser 1.43 (*)    Calcium 10.4 (*)    GFR calc non Af Amer 32 (*)    GFR calc Af Amer 37 (*)    All other components within normal limits  CBC - Abnormal; Notable for the following components:   WBC 12.9 (*)    RBC 5.24 (*)    All other components within normal limits  TROPONIN I - Abnormal; Notable  for the following components:   Troponin I 0.03 (*)    All other components within normal limits  LIPASE, BLOOD - Abnormal; Notable for the following components:   Lipase 70 (*)    All other components within normal limits  URINALYSIS, COMPLETE (UACMP) WITH MICROSCOPIC - Abnormal; Notable for the following components:   Color, Urine YELLOW (*)    APPearance CLOUDY (*)    Protein, ur 100 (*)    Leukocytes, UA LARGE (*)    Bacteria, UA RARE (*)    Squamous Epithelial / LPF 0-5 (*)    Non Squamous Epithelial 0-5 (*)    All other components within normal limits  URINE CULTURE  INFLUENZA PANEL BY PCR (TYPE A & B)   ____________________________________________  EKG  ED ECG REPORT I, [place] N Mariell Nester, the attending physician, personally viewed and interpreted this ECG.   Date: 04/28/2017  EKG Time: 7:57 AM  Rate: 58  Rhythm: Normal sinus rhythm  Axis: Normal  Intervals: Normal  ST&T Change: None  ____________________________________________  RADIOLOGY I, [place] N Kyrstyn Greear, personally viewed and evaluated these images (plain radiographs) as part of my medical decision making, as well as reviewing the written report by the radiologist.  Dg Chest Port 1 View  Result Date:  04/28/2017 CLINICAL DATA:  Generalized weakness. EXAM: PORTABLE CHEST 1 VIEW COMPARISON:  Radiographs of November 26, 2016. FINDINGS: The heart size and mediastinal contours are within normal limits. Atherosclerosis of thoracic aorta is noted. No pneumothorax or pleural effusion is noted. Left lung is clear. Minimal right basilar subsegmental atelectasis or scarring is noted. The visualized skeletal structures are unremarkable. IMPRESSION: Minimal right basilar subsegmental atelectasis or scarring. Aortic atherosclerosis. Electronically Signed   By: Marijo Conception, M.D.   On: 04/28/2017 08:01      Procedures   ____________________________________________   INITIAL IMPRESSION / ASSESSMENT AND PLAN / ED COURSE  As part of my medical decision making, I reviewed the following data within the electronic MEDICAL RECORD NUMBER 83 year old female presenting with above stated history of physical exam consistent dehydration. Urinalysis consistent with UTI for which patient received 1 g IV ceftriaxone. Indications laboratory data concerning for renal insufficiency BUN 32 creatinine 1.43 most likely prerenal secondary to dehydration. In addition patient noted to have a lipase of 70 with abdominal discomfort and a such CT scan of the abdomen and pelvis was performed. Patient discussed with Dr. Belia Heman for hospital admission further evaluation and management. ____________________________________________  FINAL CLINICAL IMPRESSION(S) / ED DIAGNOSES  Final diagnoses:  Acute cystitis without hematuria  Encephalopathy  Dehydration     MEDICATIONS GIVEN DURING THIS VISIT:  Medications  iopamidol (ISOVUE-300) 61 % injection 15 mL (15 mLs Oral Contrast Given 04/28/17 0920)  0.9 %  sodium chloride infusion ( Intravenous New Bag/Given 04/28/17 1121)  ondansetron (ZOFRAN) tablet 4 mg ( Oral See Alternative 04/28/17 1128)    Or  ondansetron (ZOFRAN) injection 4 mg (4 mg Intravenous Given 04/28/17 1128)  cefTRIAXone  (ROCEPHIN) 1 g in dextrose 5 % 50 mL IVPB - Premix (0 g Intravenous Stopped 04/28/17 1043)     ED Discharge Orders    None       Note:  This document was prepared using Dragon voice recognition software and may include unintentional dictation errors.    Gregor Hams, MD 04/28/17 [phone]

## 2017-04-28 NOTE — ED Notes (Signed)
ED Provider at bedside speaking with pt and family

## 2017-04-28 NOTE — ED Notes (Addendum)
Pt noted to large episode of diarrhea and vomiting. Pt placed back in bed with a bp of  101/63. Pt got very dizzy and weak while on the toilet.

## 2017-04-28 NOTE — ED Notes (Signed)
Pt assisted up to bathroom to obtain urine spec then assisted back to stretcher. Pt started drinking contrast for ct scan.

## 2017-04-28 NOTE — ED Notes (Signed)
Pt cleaned and linen changed. Pt will go to CT in a few minutes.

## 2017-04-28 NOTE — Progress Notes (Signed)
Pharmacy Antibiotic Note  Kristin Brewer is a 83 y.o. female admitted on 04/28/2017 with UTI.  Pharmacy has been consulted for ceftriaxone dosing.  Patient received one dose of ceftriaxone in the ED this morning.  Plan: Will continue ceftriaxone 1 g IV daily starting tomorrow.  Height: 5\' 1"  (154.9 cm) Weight: 121 lb (54.9 kg) IBW/kg (Calculated) : 47.8  Temp (24hrs), Avg:97.6 F (36.4 C), Min:97.6 F (36.4 C), Max:97.6 F (36.4 C)  Recent Labs  Lab 04/28/17 0746  WBC 12.9*  CREATININE 1.43*    Estimated Creatinine Clearance: 20.5 mL/min (A) (by C-G formula based on SCr of 1.43 mg/dL (H)).    Allergies  Allergen Reactions  . Penicillins     Has patient had a PCN reaction causing immediate rash, facial/tongue/throat swelling, SOB or lightheadedness with hypotension: Unknown Has patient had a PCN reaction causing severe rash involving mucus membranes or skin necrosis: Unknown Has patient had a PCN reaction that required hospitalization: No Has patient had a PCN reaction occurring within the last 10 years: No If all of the above answers are "NO", then may proceed with Cephalosporin use.   . Ciprofloxacin Rash    Antimicrobials this admission: ceftriaxone 12/17 >>    Dose adjustments this admission:  Microbiology results: 12/17 UCx: Sent   Thank you for allowing pharmacy to be a part of this patient's care.  Lenis Noon, PharmD, BCPS Clinical Pharmacist 04/28/2017 12:35 PM

## 2017-04-28 NOTE — H&P (Signed)
Kristin Brewer at [place] NAME: Kristin Brewer    MR#:  [id number]  DATE OF BIRTH:  Mar 26, 1927  DATE OF ADMISSION:  04/28/2017  PRIMARY CARE PHYSICIAN: Idelle Crouch, MD   REQUESTING/REFERRING PHYSICIAN: Dr. Marjean Donna  CHIEF COMPLAINT:   Chief Complaint  Patient presents with  . Fall    HISTORY OF PRESENT ILLNESS:  Kristin Brewer  is a 83 y.o. female with a known history of mild cognitive decline, remote history of breast cancer, hypertension, CKD stage II presents to hospital secondary to extensive weakness and almost a fall this morning. Patient has been feeling weak for the last couple of days, denies any fevers but had some chills. She is usually at 2 and independently using her cane in the house. This morning she got up to use the bathroom and couldn't get up and almost had a fall. Family caught her and brought her to the emergency room. She is noted to be dehydrated with elevated creatinine and calcium. Urine analysis indicates UTI. Patient denies any dysuria. Had a couple of episodes of loose bowel movements this morning. Also occasional abdominal pain across her upper abdomen noted. She had a similar admission 6 months ago and was noted to have UTI at that time too.  PAST MEDICAL HISTORY:   Past Medical History:  Diagnosis Date  . Aortic stenosis   . Breast cancer ([place])   . CKD (chronic kidney disease), stage II   . Dementia   . Frequent falls   . Hypertension     PAST SURGICAL HISTORY:   Past Surgical History:  Procedure Laterality Date  . BREAST LUMPECTOMY    . CHOLECYSTECTOMY    . KNEE SURGERY Bilateral   . THYROIDECTOMY      SOCIAL HISTORY:   Social History   Tobacco Use  . Smoking status: Never Smoker  . Smokeless tobacco: Never Used  Substance Use Topics  . Alcohol use: Yes    FAMILY HISTORY:   Family History  Problem Relation Age of Onset  . CAD Mother   . CAD Father     DRUG ALLERGIES:    Allergies  Allergen Reactions  . Penicillins     Has patient had a PCN reaction causing immediate rash, facial/tongue/throat swelling, SOB or lightheadedness with hypotension: Unknown Has patient had a PCN reaction causing severe rash involving mucus membranes or skin necrosis: Unknown Has patient had a PCN reaction that required hospitalization: No Has patient had a PCN reaction occurring within the last 10 years: No If all of the above answers are "NO", then may proceed with Cephalosporin use.   . Ciprofloxacin Rash    REVIEW OF SYSTEMS:   Review of Systems  Constitutional: Negative for chills, fever, malaise/fatigue and weight loss.       Poor oral intake  HENT: Negative for ear discharge, ear pain, hearing loss, nosebleeds and tinnitus.   Eyes: Negative for blurred vision, double vision and photophobia.  Respiratory: Negative for cough, hemoptysis, shortness of breath and wheezing.   Cardiovascular: Negative for chest pain, palpitations, orthopnea and leg swelling.  Gastrointestinal: Positive for nausea. Negative for abdominal pain, constipation, diarrhea, heartburn, melena and vomiting.  Genitourinary: Negative for dysuria, frequency, hematuria and urgency.  Musculoskeletal: Positive for falls and myalgias. Negative for back pain and neck pain.  Skin: Negative for rash.  Neurological: Positive for weakness. Negative for dizziness, tremors, sensory change, speech change, focal weakness and headaches.  Endo/Heme/Allergies: Does not  bruise/bleed easily.  Psychiatric/Behavioral: Negative for depression.    MEDICATIONS AT HOME:   Prior to Admission medications   Medication Sig Start Date End Date Taking? Authorizing Provider  potassium chloride SA (K-DUR,KLOR-CON) 20 MEQ tablet Take 1 tablet by mouth daily. 09/26/16 09/26/17 Yes [provider]  amLODipine (NORVASC) 5 MG tablet Take 5 mg by mouth daily.  07/21/15   [provider]  aspirin 81 MG EC tablet Take  81 mg by mouth daily. 03/31/16   [provider]  atorvastatin (LIPITOR) 40 MG tablet Take 1 tablet (40 mg total) by mouth daily at 6 PM. 12/03/16   Max Sane, MD  CRANBERRY PO Take 2 tablets by mouth daily.    [provider]  donepezil (ARICEPT) 5 MG tablet Take 5 mg by mouth at bedtime.  02/12/16 02/11/17  [provider]  escitalopram (LEXAPRO) 10 MG tablet Take 10 mg by mouth daily.    [provider]  HYDROcodone-acetaminophen (NORCO/VICODIN) 5-325 MG tablet Take 1 tablet by mouth every 6 (six) hours as needed. for pain 03/19/17   [provider]  indapamide (LOZOL) 2.5 MG tablet TAKE 1 TABLET (2.5 MG TOTAL) BY MOUTH ONCE DAILY. 03/31/16   [provider]  KLOR-CON 10 10 MEQ tablet Take 20 mEq by mouth daily.  03/31/16   [provider]  lisinopril (PRINIVIL,ZESTRIL) 5 MG tablet Take 1 tablet (5 mg total) by mouth daily. 12/03/16   Max Sane, MD  LORazepam (ATIVAN) 1 MG tablet Take 1 tablet (1 mg total) by mouth at bedtime as needed. 12/03/16   Max Sane, MD  magnesium oxide (MAG-OX) 400 (241.3 Mg) MG tablet Take 1 tablet by mouth daily. 03/25/16   [provider]  magnesium oxide (MAG-OX) 400 MG tablet Take 1 tablet by mouth daily. 03/21/17   [provider]  metoprolol succinate (TOPROL-XL) 100 MG 24 hr tablet TAKE 1 TABLET (100 MG TOTAL) BY MOUTH ONCE DAILY. 05/31/16   [provider]  ranitidine (ZANTAC) 150 MG tablet TAKE 1 TABLET (150 MG TOTAL) BY MOUTH 2 (TWO) TIMES DAILY. 03/31/16   [provider]  sertraline (ZOLOFT) 50 MG tablet Take 1 tablet by mouth every other day. 04/02/17   [provider]  tolterodine (DETROL LA) 4 MG 24 hr capsule Take 4 mg by mouth daily. 05/31/16   [provider]  Vitamin D, Ergocalciferol, (DRISDOL) 50000 units CAPS capsule TAKE 1 CAPSULE BY MOUTH EVERY MONTH 06/01/16   [provider]      VITAL SIGNS:  Blood pressure 107/70,  pulse 65, temperature 97.6 F (36.4 C), temperature source Oral, resp. rate 20, height 5\' 1"  (1.549 m), weight 54.9 kg (121 lb), SpO2 100 %.  PHYSICAL EXAMINATION:   Physical Exam  GENERAL:  83 y.o.-year-old elderly patient lying in the bed with no acute distress.  EYES: Pupils equal, round, reactive to light and accommodation. No scleral icterus. Extraocular muscles intact.  HEENT: Head atraumatic, normocephalic. Oropharynx and nasopharynx clear.  NECK:  Supple, no jugular venous distention. No thyroid enlargement, no tenderness.  LUNGS: Normal breath sounds bilaterally, no wheezing, rales,rhonchi or crepitation. No use of accessory muscles of respiration.  CARDIOVASCULAR: S1, S2 normal. No rubs, or gallops. 2/6 systolic murmur is heard ABDOMEN: Soft, nontender, nondistended. Bowel sounds present. No organomegaly or mass.  EXTREMITIES: No pedal edema, cyanosis, or clubbing. Bilateral swelling at the knee is noted with some tenderness to touch NEUROLOGIC: Cranial nerves II through XII are intact. Muscle strength 5/5  in all extremities. Sensation intact. Gait not checked. Global weakness present PSYCHIATRIC: The patient is alert and oriented x 3.  SKIN: No obvious rash, lesion, or ulcer.   LABORATORY PANEL:   CBC Recent Labs  Lab 04/28/17 0746  WBC 12.9*  HGB 15.7  HCT 46.8  PLT 215   ------------------------------------------------------------------------------------------------------------------  Chemistries  Recent Labs  Lab 04/28/17 0746  NA 140  K 3.5  CL 104  CO2 23  GLUCOSE 139*  BUN 32*  CREATININE 1.43*  CALCIUM 10.4*   ------------------------------------------------------------------------------------------------------------------  Cardiac Enzymes Recent Labs  Lab 04/28/17 0746  TROPONINI 0.03*   ------------------------------------------------------------------------------------------------------------------  RADIOLOGY:  Dg Chest Port 1  View  Result Date: 04/28/2017 CLINICAL DATA:  Generalized weakness. EXAM: PORTABLE CHEST 1 VIEW COMPARISON:  Radiographs of November 26, 2016. FINDINGS: The heart size and mediastinal contours are within normal limits. Atherosclerosis of thoracic aorta is noted. No pneumothorax or pleural effusion is noted. Left lung is clear. Minimal right basilar subsegmental atelectasis or scarring is noted. The visualized skeletal structures are unremarkable. IMPRESSION: Minimal right basilar subsegmental atelectasis or scarring. Aortic atherosclerosis. Electronically Signed   By: Marijo Conception, M.D.   On: 04/28/2017 08:01    EKG:   Orders placed or performed during the hospital encounter of 04/28/17  . ED EKG  . ED EKG  . EKG 12-Lead  . EKG 12-Lead  . EKG 12-Lead  . EKG 12-Lead  . EKG 12-Lead  . EKG 12-Lead    IMPRESSION AND PLAN:   Kristin Brewer  is a 83 y.o. female with a known history of mild cognitive decline, remote history of breast cancer, hypertension, CKD stage II presents to hospital secondary to extensive weakness and almost a fall this morning.  1. Acute cystitis-causing the weakness and falls -Urine cultures sent for. -Started Rocephin. Neck and-physical therapy consult.  2. Slightly elevated troponin-known history of aortic stenosis. Could be from that or demand ischemia from infection. -Less likely to be myocardial infarction. Monitor on-and-off unit telemetry and recycle troponins.  3. Acute renal failure and hypercalcemia-induced agony to prerenal causes. Gentle IV hydration, avoid nephrotoxins and monitor.  4. Hypertension-continue lisinopril and Norvasc.  5. Depression-continue home medications. Patient on Lexapro and Zoloft  6. DVT prophylaxis-Lovenox   All the records are reviewed and case discussed with ED provider. Management plans discussed with the patient, family and they are in agreement.  CODE STATUS: DO NOT RESUSCITATE-patient has a signed DO NOT RESUSCITATE and  the chart Confirmed with her and the family at bedside on admission  TOTAL TIME TAKING CARE OF THIS PATIENT: 50 minutes.    Gladstone Lighter M.D on 04/28/2017 at 11:04 AM  Between 7am to 6pm - Pager - [phone]  After 6pm go to [email or web address] - password [email or web address] [facility]  Office  [phone]  CC: Primary care physician; Idelle Crouch, MD

## 2017-04-28 NOTE — ED Notes (Signed)
Dr Tressia Miners notified of diarrhea and vomiting episode, orders to give 500cc NS bolus at this time. Continue to monitor.

## 2017-04-28 NOTE — ED Triage Notes (Signed)
Patient presents to Emergency Department from home via EMS with complaints of bilateral knee pain and upper back pain after falling.  Pt denies LOC or blood thinners.  Pt reports some poor appetite over the last 3 days and generalized weakness.  Denies N/V/D or fever and chills.  Pt lives with daughter and son in law, and these people called EMS after hearing pt fall.  No apparent swelling or trauma to knees or back, but pt reflect pain to right knee with movement and upper back with palpation.

## 2017-04-29 LAB — BASIC METABOLIC PANEL
ANION GAP: 9 (ref 5–15)
BUN: 29 mg/dL — ABNORMAL HIGH (ref 6–20)
CO2: 22 mmol/L (ref 22–32)
Calcium: 8.7 mg/dL — ABNORMAL LOW (ref 8.9–10.3)
Chloride: 107 mmol/L (ref 101–111)
Creatinine, Ser: 1.27 mg/dL — ABNORMAL HIGH (ref 0.44–1.00)
GFR calc Af Amer: 42 mL/min — ABNORMAL LOW (ref >60)
GFR, EST NON AFRICAN AMERICAN: 37 mL/min — AB (ref >60)
GLUCOSE: 107 mg/dL — AB (ref 65–99)
POTASSIUM: 3.1 mmol/L — AB (ref 3.5–5.1)
SODIUM: 138 mmol/L (ref 135–145)

## 2017-04-29 LAB — TROPONIN I: TROPONIN I: 0.05 ng/mL — AB (ref <0.03)

## 2017-04-29 LAB — CBC
HEMATOCRIT: 39.9 % (ref 35.0–47.0)
HEMOGLOBIN: 13.5 g/dL (ref 12.0–16.0)
MCH: 30 pg (ref 26.0–34.0)
MCHC: 33.8 g/dL (ref 32.0–36.0)
MCV: 88.6 fL (ref 80.0–100.0)
Platelets: 179 10*3/uL (ref 150–440)
RBC: 4.5 MIL/uL (ref 3.80–5.20)
RDW: 13.8 % (ref 11.5–14.5)
WBC: 9.7 10*3/uL (ref 3.6–11.0)

## 2017-04-29 LAB — URINE CULTURE

## 2017-04-29 LAB — MAGNESIUM: Magnesium: 1.8 mg/dL (ref 1.7–2.4)

## 2017-04-29 MED ORDER — ENSURE ENLIVE PO LIQD
237.0000 mL | Freq: Two times a day (BID) | ORAL | Status: DC
  Administered 2017-04-29 – 2017-05-02 (×6): 237 mL via ORAL

## 2017-04-29 MED ORDER — LORAZEPAM 2 MG/ML IJ SOLN
1.0000 mg | Freq: Once | INTRAMUSCULAR | Status: AC
  Administered 2017-04-29: 1 mg via INTRAVENOUS
  Filled 2017-04-29: qty 1

## 2017-04-29 MED ORDER — FAMOTIDINE 20 MG PO TABS
10.0000 mg | ORAL_TABLET | Freq: Every day | ORAL | Status: DC
  Administered 2017-04-30 – 2017-05-02 (×3): 10 mg via ORAL
  Filled 2017-04-29 (×3): qty 1

## 2017-04-29 MED ORDER — ADULT MULTIVITAMIN W/MINERALS CH
1.0000 | ORAL_TABLET | Freq: Every day | ORAL | Status: DC
  Administered 2017-04-29 – 2017-05-01 (×3): 1 via ORAL
  Filled 2017-04-29 (×4): qty 1

## 2017-04-29 MED ORDER — POTASSIUM CHLORIDE CRYS ER 20 MEQ PO TBCR
40.0000 meq | EXTENDED_RELEASE_TABLET | Freq: Once | ORAL | Status: AC
  Administered 2017-04-29: 40 meq via ORAL
  Filled 2017-04-29: qty 2

## 2017-04-29 MED ORDER — LORAZEPAM 2 MG/ML IJ SOLN
1.0000 mg | Freq: Four times a day (QID) | INTRAMUSCULAR | Status: DC | PRN
  Administered 2017-04-29: 1 mg via INTRAVENOUS
  Filled 2017-04-29: qty 1

## 2017-04-29 NOTE — Progress Notes (Signed)
Initial Nutrition Assessment  DOCUMENTATION CODES:   Not applicable  INTERVENTION:   Ensure Enlive po BID, each supplement provides 350 kcal and 20 grams of protein  MVI daily  Liberalize diet   Bowel regimen as needed   NUTRITION DIAGNOSIS:   Inadequate oral intake related to acute illness as evidenced by per patient/family report.  GOAL:   Patient will meet greater than or equal to 90% of their needs  MONITOR:   PO intake, Supplement acceptance, Weight trends, Labs, I & O's  REASON FOR ASSESSMENT:   Malnutrition Screening Tool    ASSESSMENT:    83 y.o. female with a known history of mild cognitive decline, remote history of breast cancer, hypertension, CKD stage II presents to hospital secondary to extensive weakness and almost a fall admitted with UTI   Met with pt in room today. Pt reports decreased appetite and oral intake for 3 days pta but reports appetite is improved today. Pt is currently eating 100% of meals. RD will order Ensure and MVI to help pt meet her estimated needs. Per chart, pt is weight stable. Pt with low potassium today; monitor and supplement as needed per MD discretion.   Medications reviewed and include: aspirin, lovenox, pepcid, Mg oxide, ceftriaxone, hydrocodone   Labs reviewed: K 3.1(L), BUN 29(H), creat 1.27(H), Ca 8.7(L)  Nutrition-Focused physical exam completed. Findings are no fat depletion, moderate muscle depletions in clavicles, shoulders, and hands, and no edema.   Diet Order:  Diet regular Room service appropriate? Yes; Fluid consistency: Thin  EDUCATION NEEDS:   Education needs have been addressed  Skin:  Reviewed RN Assessment  Last BM:  12/18- small amount   Height:   Ht Readings from Last 1 Encounters:  04/28/17 5' 1"  (1.549 m)    Weight:   Wt Readings from Last 1 Encounters:  04/28/17 121 lb (54.9 kg)    Ideal Body Weight:  47.7 kg  BMI:  Body mass index is 22.86 kg/m.  Estimated Nutritional Needs:    Kcal:  1300-1500kcal/day   Protein:  60-71g/day   Fluid:  >1.3L/day   Koleen Distance MS, RD, LDN Pager #[phone] After Hours Pager: [phone]

## 2017-04-29 NOTE — Progress Notes (Signed)
[facility] [place] NAME: Kristin Brewer    MR#:  [id number]  DATE OF BIRTH:  07/23/1926  SUBJECTIVE:  CHIEF COMPLAINT:   Chief Complaint  Patient presents with  . Fall   Generalized weakness. REVIEW OF SYSTEMS:  Review of Systems  Constitutional: Positive for malaise/fatigue. Negative for chills and fever.  HENT: Negative for sore throat.   Eyes: Negative for blurred vision and double vision.  Respiratory: Negative for cough, hemoptysis, shortness of breath, wheezing and stridor.   Cardiovascular: Negative for chest pain, palpitations, orthopnea and leg swelling.  Gastrointestinal: Negative for abdominal pain, blood in stool, diarrhea, melena, nausea and vomiting.  Genitourinary: Negative for dysuria, flank pain and hematuria.  Musculoskeletal: Negative for back pain and joint pain.  Skin: Negative for rash.  Neurological: Positive for weakness. Negative for dizziness, sensory change, focal weakness, seizures, loss of consciousness and headaches.  Endo/Heme/Allergies: Negative for polydipsia.  Psychiatric/Behavioral: Negative for depression. The patient is not nervous/anxious.     DRUG ALLERGIES:   Allergies  Allergen Reactions  . Penicillins     Has patient had a PCN reaction causing immediate rash, facial/tongue/throat swelling, SOB or lightheadedness with hypotension: Unknown Has patient had a PCN reaction causing severe rash involving mucus membranes or skin necrosis: Unknown Has patient had a PCN reaction that required hospitalization: No Has patient had a PCN reaction occurring within the last 10 years: No If all of the above answers are "NO", then may proceed with Cephalosporin use.   . Ciprofloxacin Rash   VITALS:  Blood pressure 107/64, pulse (!) 57, temperature 98 F (36.7 C), temperature source Oral, resp. rate 19, height 5\' 1"  (1.549 m), weight 121 lb (54.9 kg), SpO2 96 %. PHYSICAL EXAMINATION:  Physical Exam    Constitutional: She is oriented to person, place, and time and well-developed, well-nourished, and in no distress.  HENT:  Head: Normocephalic.  Mouth/Throat: Oropharynx is clear and moist.  Eyes: Conjunctivae and EOM are normal. Pupils are equal, round, and reactive to light. No scleral icterus.  Neck: Normal range of motion. Neck supple. No JVD present. No tracheal deviation present.  Cardiovascular: Normal rate, regular rhythm and normal heart sounds. Exam reveals no gallop.  No murmur heard. Pulmonary/Chest: Effort normal and breath sounds normal. No respiratory distress. She has no wheezes. She has no rales.  Abdominal: Soft. Bowel sounds are normal. She exhibits no distension. There is no tenderness. There is no rebound.  Musculoskeletal: Normal range of motion. She exhibits no edema or tenderness.  Neurological: She is alert and oriented to person, place, and time. No cranial nerve deficit.  Skin: No rash noted. No erythema.  Psychiatric: Affect normal.   LABORATORY PANEL:  Female CBC Recent Labs  Lab 04/29/17 0117  WBC 9.7  HGB 13.5  HCT 39.9  PLT 179   ------------------------------------------------------------------------------------------------------------------ Chemistries  Recent Labs  Lab 04/29/17 0116 04/29/17 0117  NA  --  138  K  --  3.1*  CL  --  107  CO2  --  22  GLUCOSE  --  107*  BUN  --  29*  CREATININE  --  1.27*  CALCIUM  --  8.7*  MG 1.8  --    RADIOLOGY:  No results found. ASSESSMENT AND PLAN:    [name]  is a 83 y.o. female with a known history of mild cognitive decline, remote history of breast cancer, hypertension, CKD stage II presents to hospital secondary to  extensive weakness and almost a fall this morning.  1. Acute cystitis-causing the weakness and falls -Started Rocephin.  Follow-up urine culture.  2. Slightly elevated troponin-known history of aortic stenosis. Could be from that or demand ischemia from infection.  3.  Acute renal failure and hypercalcemia-induced agony to prerenal causes. Gentle IV hydration, avoid nephrotoxins and monitor.  4. Hypertension-continue lisinopril and Norvasc.  Hold if blood pressure is low.  Hypokalemia.  Given potassium supplement and follow-up level.  Technetium is normal.  Generalized weakness.  PT evaluation suggest home health and PT with rolling walker. All the records are reviewed and case discussed with Care Management/Social Worker. Management plans discussed with the patient, family and they are in agreement.  CODE STATUS: DNR  TOTAL TIME TAKING CARE OF THIS PATIENT: 33 minutes.   More than 50% of the time was spent in counseling/coordination of care: YES  POSSIBLE D/C IN 1-2 DAYS, DEPENDING ON CLINICAL CONDITION.   Demetrios Loll M.D on 04/29/2017 at 3:42 PM  Between 7am to 6pm - Pager - [phone]  After 6pm go to [email or web address] - [facility]

## 2017-04-29 NOTE — Plan of Care (Signed)
Pt with increasing alertness. Ambulating around the unit with light assist. IV abx. Possible d/c in am.

## 2017-04-29 NOTE — Progress Notes (Signed)
Assumed care of pt @ 1915. Pt agitated and was verbally aggressive upon this RN entering the room. Pt refused to allow me to do my nursing assessment. Will attempt at a later time if pt becomes cooperative.

## 2017-04-29 NOTE — Evaluation (Signed)
Physical Therapy Evaluation Patient Details Name: Kristin Brewer MRN: [id number] DOB: [birth year] Today's Date: 04/29/2017   History of Present Illness  presented to ER secondary to progressive weakness, fall in home environment; admitted with acute cystitis/UTI.  Noted with mild elevation in troponin (0.06), likely due to AS/demand ischemia per chart.  Clinical Impression  Upon evaluation, patient alert and oriented to basic information; follows commands, but mildly confused/forgetful to new information.  Bilat UE/LE strength and ROM grossly WFL for basic transfers and mobility; does report mod L knee pain (reports previous fall onto knees prior to admission).  Small pocket of edema noted at medial joint line, but patient able to move joint through ROM, WB without significant difficulty.  RN informed/aware.  Currently requiring min assist for bed mobility, sit/stand, basic transfers and gait (58') with RW; slow and guarded, but without overt buckling or LOB.  Very limited balance reactions, evidenced by inability to safely maintain SLS and with positive romberg; do recommend continued use of RW for all mobility at this time.  Patient voices understanding; will reinforce as needed throughout remaining stay. Would benefit from skilled PT to address above deficits and promote optimal return to PLOF; Recommend transition to [place] upon discharge from acute hospitalization.  Will need to assess/complete stair negotiation prior to discharge.     Follow Up Recommendations Home health PT;Supervision/Assistance - 24 hour    Equipment Recommendations  Rolling walker with 5" wheels    Recommendations for Other Services       Precautions / Restrictions Precautions Precautions: Fall Restrictions Weight Bearing Restrictions: No      Mobility  Bed Mobility Overal bed mobility: Needs Assistance Bed Mobility: Supine to Sit     Supine to sit: Min assist        Transfers Overall transfer level:  Needs assistance Equipment used: Rolling walker (2 wheeled) Transfers: Sit to/from Stand Sit to Stand: Min assist         General transfer comment: increased time, cuing for hand placement; tends to utilize L > R LE as primary WBing with movement transitions  Ambulation/Gait Ambulation/Gait assistance: Min assist;Min guard Ambulation Distance (Feet): 50 Feet Assistive device: Rolling walker (2 wheeled)     Gait velocity interpretation: Below normal speed for age/gender General Gait Details: reciprocal stepping pattern with limited step height/length; slow and guarded with limited balance reactions evident; no difficulty/increase in L knee pain reported with WBing.  Do recommend continued use of RW and +1 assist for safety at discharge.  Stairs            Wheelchair Mobility    Modified Rankin (Stroke Patients Only)       Balance Overall balance assessment: Needs assistance Sitting-balance support: No upper extremity supported;Feet supported Sitting balance-Leahy Scale: Good     Standing balance support: No upper extremity supported Standing balance-Leahy Scale: Poor   Single Leg Stance - Right Leg: 0 Single Leg Stance - Left Leg: 0     Rhomberg - Eyes Opened: 8 Rhomberg - Eyes Closed: 2                 Pertinent Vitals/Pain Pain Assessment: Faces Faces Pain Scale: Hurts little more Pain Location: L knee Pain Descriptors / Indicators: Aching Pain Intervention(s): Limited activity within patient's tolerance;Repositioned;Monitored during session;Patient requesting pain meds-RN notified    Home Living Family/patient expects to be discharged to:: Private residence Living Arrangements: Children Available Help at Discharge: Family;Available 24 hours/day Type of Home: House Home Access: Level entry  Home Layout: Multi-level;Bed/bath upstairs Home Equipment: Walker - standard;Cane - single point      Prior Function Level of Independence:  Independent with assistive device(s)         Comments: Mod indep with SPC vs RW for ADLs, household and limited community mobility; endorses multiple fall history.  Family available to assist with functional activities, household responsibilities as needed.     Hand Dominance   Dominant Hand: Right    Extremity/Trunk Assessment   Upper Extremity Assessment Upper Extremity Assessment: Overall WFL for tasks assessed    Lower Extremity Assessment Lower Extremity Assessment: Overall WFL for tasks assessed(grossly at least 3+/5 throughout bilat LEs; L knee generally guarded due to pain (small pocket of edema medial joint line))       Communication   Communication: No difficulties  Cognition Arousal/Alertness: Awake/alert Behavior During Therapy: WFL for tasks assessed/performed Overall Cognitive Status: Within Functional Limits for tasks assessed                                 General Comments: mild confusion, memory deficits noted      General Comments      Exercises     Assessment/Plan    PT Assessment Patient needs continued PT services  PT Problem List Decreased strength;Decreased range of motion;Decreased cognition;Decreased activity tolerance;Decreased balance;Decreased mobility;Decreased coordination;Decreased knowledge of use of DME;Decreased safety awareness;Decreased knowledge of precautions;Pain       PT Treatment Interventions DME instruction;Gait training;Stair training;Functional mobility training;Therapeutic activities;Balance training;Cognitive remediation;Therapeutic exercise;Patient/family education    PT Goals (Current goals can be found in the Care Plan section)  Acute Rehab PT Goals Patient Stated Goal: to return home PT Goal Formulation: With patient Time For Goal Achievement: 05/13/17 Potential to Achieve Goals: Good    Frequency Min 2X/week   Barriers to discharge        Co-evaluation               AM-PAC PT "6  Clicks" Daily Activity  Outcome Measure Difficulty turning over in bed (including adjusting bedclothes, sheets and blankets)?: Unable Difficulty moving from lying on back to sitting on the side of the bed? : Unable Difficulty sitting down on and standing up from a chair with arms (e.g., wheelchair, bedside commode, etc,.)?: Unable Help needed moving to and from a bed to chair (including a wheelchair)?: A Little Help needed walking in hospital room?: A Little Help needed climbing 3-5 steps with a railing? : A Little 6 Click Score: 12    End of Session Equipment Utilized During Treatment: Gait belt Activity Tolerance: Patient tolerated treatment well Patient left: in bed;with call bell/phone within reach;with chair alarm set Nurse Communication: Mobility status PT Visit Diagnosis: Muscle weakness (generalized) (M62.81);History of falling (Z91.81);Difficulty in walking, not elsewhere classified (R26.2);Pain Pain - Right/Left: Left Pain - part of body: Knee    Time: 8676-1950 PT Time Calculation (min) (ACUTE ONLY): 19 min   Charges:   PT Evaluation $PT Eval Low Complexity: 1 Low PT Treatments $Therapeutic Activity: 8-22 mins   PT G Codes:   PT G-Codes **NOT FOR INPATIENT CLASS** Functional Assessment Tool Used: AM-PAC 6 Clicks Basic Mobility Functional Limitation: Mobility: Walking and moving around Mobility: Walking and Moving Around Current Status (D3267): At least 20 percent but less than 40 percent impaired, limited or restricted Mobility: Walking and Moving Around Goal Status [phone]): At least 1 percent but less than 20 percent impaired, limited  or restricted   Zakiyah Diop H. Owens Shark, PT, DPT, NCS 04/29/17, 10:24 AM [phone]

## 2017-04-29 NOTE — Progress Notes (Addendum)
Pt suddenly with increase in confusion. Becoming verbally and physically aggressive. Left voicemail for daughter to attempt to calm patient. Unable to redirect. Current order for ativan at bedtime but pt refusing to take. Tele sitter to be placed. IV ativanx1 ordered by prime doc. Will administer and continue to monitor.

## 2017-04-29 NOTE — Care Management (Signed)
Patient admitted from home with acute cystitis.  Patient lives at home with daughter.  Daughter provides transportation. P[facility].  Pharmacy [facility].  PT has assessed patient and recommends home health PT.  Patient agreeable to services.  Patient does not have preference of home health agency.  Heads up Referral made to [facility] with [facility] home health.  Patient has RW and cane in the home for ambulation . RNCM following.

## 2017-04-30 LAB — BASIC METABOLIC PANEL
ANION GAP: 7 (ref 5–15)
BUN: 26 mg/dL — ABNORMAL HIGH (ref 6–20)
CALCIUM: 9.3 mg/dL (ref 8.9–10.3)
CO2: 21 mmol/L — AB (ref 22–32)
Chloride: 110 mmol/L (ref 101–111)
Creatinine, Ser: 1.11 mg/dL — ABNORMAL HIGH (ref 0.44–1.00)
GFR calc Af Amer: 50 mL/min — ABNORMAL LOW (ref >60)
GFR calc non Af Amer: 43 mL/min — ABNORMAL LOW (ref >60)
GLUCOSE: 97 mg/dL (ref 65–99)
POTASSIUM: 3.6 mmol/L (ref 3.5–5.1)
Sodium: 138 mmol/L (ref 135–145)

## 2017-04-30 MED ORDER — TRAZODONE HCL 50 MG PO TABS
50.0000 mg | ORAL_TABLET | Freq: Every evening | ORAL | Status: DC | PRN
  Administered 2017-04-30 – 2017-05-01 (×2): 50 mg via ORAL
  Filled 2017-04-30 (×2): qty 1

## 2017-04-30 NOTE — Progress Notes (Signed)
Pt allowed this RN to complete assessment but remained verbally aggressive. Complained of neck and shoulder pain. Tylenol given for pain relief but pt spit them out at another nurse. Pt continuing to get out of bed and attempt unsafe ambulation.  Telesitter ineffective. Dr Tressia Miners notified. PRN sitter at bedside ordered. 1mg  IV ativan ordered and given for agitation, pt eventually calmed down and fell asleep.

## 2017-04-30 NOTE — Care Management Important Message (Signed)
Important Message  Patient Details  Name: Kristin Brewer MRN: [id number] Date of Birth: 15-Feb-1927   Medicare Important Message Given:  Yes    Beverly Sessions, RN 04/30/2017, 3:12 PM

## 2017-04-30 NOTE — Progress Notes (Signed)
[place] [facility] [place] NAME: Kristin Brewer    MR#:  [id number]  DATE OF BIRTH:  12/09/1926  SUBJECTIVE:  CHIEF COMPLAINT:   Chief Complaint  Patient presents with  . Fall   The patient was agitated last night but quite today. REVIEW OF SYSTEMS:  Review of Systems  Constitutional: Positive for malaise/fatigue. Negative for chills and fever.  HENT: Negative for sore throat.   Eyes: Negative for blurred vision and double vision.  Respiratory: Negative for cough, hemoptysis, shortness of breath, wheezing and stridor.   Cardiovascular: Negative for chest pain, palpitations, orthopnea and leg swelling.  Gastrointestinal: Negative for abdominal pain, blood in stool, diarrhea, melena, nausea and vomiting.  Genitourinary: Negative for dysuria, flank pain and hematuria.  Musculoskeletal: Negative for back pain and joint pain.  Skin: Negative for rash.  Neurological: Positive for weakness. Negative for dizziness, sensory change, focal weakness, seizures, loss of consciousness and headaches.  Endo/Heme/Allergies: Negative for polydipsia.  Psychiatric/Behavioral: Negative for depression. The patient is not nervous/anxious.     DRUG ALLERGIES:   Allergies  Allergen Reactions  . Penicillins     Has patient had a PCN reaction causing immediate rash, facial/tongue/throat swelling, SOB or lightheadedness with hypotension: Unknown Has patient had a PCN reaction causing severe rash involving mucus membranes or skin necrosis: Unknown Has patient had a PCN reaction that required hospitalization: No Has patient had a PCN reaction occurring within the last 10 years: No If all of the above answers are "NO", then may proceed with Cephalosporin use.   . Ciprofloxacin Rash   VITALS:  Blood pressure (!) 99/43, pulse (!) 52, temperature 98.3 F (36.8 C), temperature source Oral, resp. rate 18, height 5\' 1"  (1.549 m), weight 121 lb (54.9 kg), SpO2 96  %. PHYSICAL EXAMINATION:  Physical Exam  Constitutional: She is oriented to person, place, and time and well-developed, well-nourished, and in no distress.  HENT:  Head: Normocephalic.  Mouth/Throat: Oropharynx is clear and moist.  Eyes: Conjunctivae and EOM are normal. Pupils are equal, round, and reactive to light. No scleral icterus.  Neck: Normal range of motion. Neck supple. No JVD present. No tracheal deviation present.  Cardiovascular: Normal rate, regular rhythm and normal heart sounds. Exam reveals no gallop.  No murmur heard. Pulmonary/Chest: Effort normal and breath sounds normal. No respiratory distress. She has no wheezes. She has no rales.  Abdominal: Soft. Bowel sounds are normal. She exhibits no distension. There is no tenderness. There is no rebound.  Musculoskeletal: Normal range of motion. She exhibits no edema or tenderness.  Neurological: She is alert and oriented to person, place, and time. No cranial nerve deficit.  Skin: No rash noted. No erythema.  Psychiatric: Affect normal.   LABORATORY PANEL:  Female CBC Recent Labs  Lab 04/29/17 0117  WBC 9.7  HGB 13.5  HCT 39.9  PLT 179   ------------------------------------------------------------------------------------------------------------------ Chemistries  Recent Labs  Lab 04/29/17 0116  04/30/17 0443  NA  --    < > 138  K  --    < > 3.6  CL  --    < > 110  CO2  --    < > 21*  GLUCOSE  --    < > 97  BUN  --    < > 26*  CREATININE  --    < > 1.11*  CALCIUM  --    < > 9.3  MG 1.8  --   --    < > =  values in this interval not displayed.   RADIOLOGY:  No results found. ASSESSMENT AND PLAN:    Kristin Brewer  is a 83 y.o. female with a known history of mild cognitive decline, remote history of breast cancer, hypertension, CKD stage II presents to hospital secondary to extensive weakness and almost a fall this morning.  1. Acute cystitis-causing the weakness and falls -Started Rocephin.  Follow-up  urine culture.  2. Slightly elevated troponin-known history of aortic stenosis. Could be from that or demand ischemia from infection.  3. Acute renal failure and hypercalcemia-induced agony to prerenal causes.  Improved with IV fluid support.  4. Hypertension-continue lisinopril and Norvasc.  Hold if blood pressure is low.  Hypokalemia.  Improved with potassium supplement.  Generalized weakness.  PT evaluation suggest home health and PT with rolling walker. All the records are reviewed and case discussed with Care Management/Social Worker. Management plans discussed with the patient, her daughter and they are in agreement.  CODE STATUS: DNR  TOTAL TIME TAKING CARE OF THIS PATIENT: 35 minutes.   More than 50% of the time was spent in counseling/coordination of care: YES  POSSIBLE D/C IN 1-2 DAYS, DEPENDING ON CLINICAL CONDITION.   Demetrios Loll M.D on 04/30/2017 at 1:34 PM  Between 7am to 6pm - Pager - [phone]  After 6pm go to [email or web address] - [facility]

## 2017-05-01 ENCOUNTER — Inpatient Hospital Stay: Payer: Medicare HMO

## 2017-05-01 LAB — URINE CULTURE: Culture: <10000 — AB

## 2017-05-01 MED ORDER — SERTRALINE HCL 50 MG PO TABS
50.0000 mg | ORAL_TABLET | Freq: Every day | ORAL | Status: DC
  Administered 2017-05-02: 50 mg via ORAL
  Filled 2017-05-01: qty 1

## 2017-05-01 MED ORDER — DONEPEZIL HCL 5 MG PO TABS
10.0000 mg | ORAL_TABLET | Freq: Every day | ORAL | Status: DC
  Filled 2017-05-01: qty 1
  Filled 2017-05-01: qty 2

## 2017-05-01 NOTE — Progress Notes (Signed)
Physical Therapy Treatment Patient Details Name: Kristin Brewer MRN: [id number] DOB: Jun 25, 1926 Today's Date: 05/01/2017    History of Present Illness presented to ER secondary to progressive weakness, fall in home environment; admitted with acute cystitis/UTI.  Noted with mild elevation in troponin (0.06), likely due to AS/demand ischemia per chart.    PT Comments    Pt not feeling well today and continues with L > R knee pain. Daughter in room and discussion/education regarding home set up/care and baseline for pt and pt/daughter living arrangement. Pt does not feel she can get up herself and perform self care and dressing for the day. Daughter requests additional therapy (OT consult) to address. Pt also has numerous steps to enter and within the home that she really needs to be able to do due to bed/bath arrangements. Daughter notes a bonus room with limited (2 steps) into that pt can stay in temporarily. Education with pt regarding knee pain/x ray results and benefits of low level isometric and movement exercises to manage swelling and prevent additional stiffness pain. Pt participates in limited exercise. Encouraged to practice several times a day and also encouraged to spend time out of bed in the chair for endurance/strength. Pt does not wish to attempt any steps today (discussion with pt/dtr as well regarding skilled care if unable to perform steps; ultimately they really wish to avoid skilled setting); therefore, discussed with pt the need to perform steps prior to discharge to ensure safety getting in/out of home. Pt can than work on building up strength to perform the 15 steps between floors with HHPT. Continue PT to progress participation, strength and endurance to allow for improved functional mobility and a safe return home.    Follow Up Recommendations  Home health PT;Supervision/Assistance - 24 hour     Equipment Recommendations  Rolling walker with 5" wheels    Recommendations for  Other Services OT consult(discussion with pt/dtr; daughter wishes OT consult)     Precautions / Restrictions Precautions Precautions: Fall Restrictions Weight Bearing Restrictions: No    Mobility  Bed Mobility               General bed mobility comments: Not tested; pt refuses out of bed  Transfers                    Ambulation/Gait                 Stairs            Wheelchair Mobility    Modified Rankin (Stroke Patients Only)       Balance                                            Cognition Arousal/Alertness: Awake/alert Behavior During Therapy: WFL for tasks assessed/performed Overall Cognitive Status: Within Functional Limits for tasks assessed                                        Exercises General Exercises - Lower Extremity Ankle Circles/Pumps: AROM;Both;10 reps;Supine Quad Sets: Strengthening;Both;10 reps;Supine Gluteal Sets: Strengthening;Both;10 reps;Supine Long Arc Quad: Other (comment)(Educated pt/dtr verbally; pt did not wish up to edge of bed ) Heel Slides: AROM;Both;10 reps;Supine(small range) Hip ABduction/ADduction: AAROM;Both;10 reps;Supine Hip Flexion/Marching: Other (comment)(educated pt/dtr verbally)  General Comments        Pertinent Vitals/Pain Pain Assessment: Faces Faces Pain Scale: Hurts even more Pain Location: B knees; L > R Pain Descriptors / Indicators: Constant;Aching Pain Intervention(s): Limited activity within patient's tolerance    Home Living                      Prior Function            PT Goals (current goals can now be found in the care plan section) Progress towards PT goals: Not progressing toward goals - comment    Frequency    Min 2X/week      PT Plan Current plan remains appropriate    Co-evaluation              AM-PAC PT "6 Clicks" Daily Activity  Outcome Measure  Difficulty turning over in bed (including  adjusting bedclothes, sheets and blankets)?: Unable Difficulty moving from lying on back to sitting on the side of the bed? : Unable Difficulty sitting down on and standing up from a chair with arms (e.g., wheelchair, bedside commode, etc,.)?: Unable Help needed moving to and from a bed to chair (including a wheelchair)?: A Little Help needed walking in hospital room?: A Little Help needed climbing 3-5 steps with a railing? : A Lot 6 Click Score: 11    End of Session   Activity Tolerance: Patient limited by pain;Other (comment)(general malaise) Patient left: in bed;with call bell/phone within reach;with bed alarm set;with family/visitor present   PT Visit Diagnosis: Muscle weakness (generalized) (M62.81);History of falling (Z91.81);Difficulty in walking, not elsewhere classified (R26.2);Pain Pain - Right/Left: Left Pain - part of body: Knee     Time: 1536-1600 PT Time Calculation (min) (ACUTE ONLY): 24 min  Charges:  $Therapeutic Exercise: 8-22 mins $Therapeutic Activity: 8-22 mins                    G CodesLarae Grooms, PTA 05/01/2017, 4:16 PM

## 2017-05-01 NOTE — Progress Notes (Signed)
[facility] [place] NAME: Kristin Brewer    MR#:  [id number]  DATE OF BIRTH:  1926/06/27  SUBJECTIVE:  CHIEF COMPLAINT:   Chief Complaint  Patient presents with  . Fall   - sundowning every evening, now more alert and complaining of knee pain - for X rays today  REVIEW OF SYSTEMS:  Review of Systems  Constitutional: Positive for malaise/fatigue. Negative for chills and fever.  HENT: Negative for congestion, ear discharge, hearing loss and nosebleeds.   Eyes: Negative for blurred vision and double vision.  Respiratory: Negative for cough, shortness of breath and wheezing.   Cardiovascular: Negative for chest pain, palpitations and leg swelling.  Gastrointestinal: Negative for abdominal pain, constipation, diarrhea, nausea and vomiting.  Genitourinary: Negative for dysuria.  Musculoskeletal: Positive for joint pain and myalgias.  Neurological: Negative for dizziness, speech change, focal weakness, seizures and headaches.  Psychiatric/Behavioral: Negative for depression.    DRUG ALLERGIES:   Allergies  Allergen Reactions  . Penicillins     Has patient had a PCN reaction causing immediate rash, facial/tongue/throat swelling, SOB or lightheadedness with hypotension: Unknown Has patient had a PCN reaction causing severe rash involving mucus membranes or skin necrosis: Unknown Has patient had a PCN reaction that required hospitalization: No Has patient had a PCN reaction occurring within the last 10 years: No If all of the above answers are "NO", then may proceed with Cephalosporin use.   . Ciprofloxacin Rash    VITALS:  Blood pressure 135/87, pulse 88, temperature 98.2 F (36.8 C), temperature source Oral, resp. rate 19, height 5\' 1"  (1.549 m), weight 54.9 kg (121 lb), SpO2 99 %.  PHYSICAL EXAMINATION:  Physical Exam  GENERAL:  83 y.o.-year-old elderly patient lying in the bed with no acute distress.  EYES: Pupils equal, round,  reactive to light and accommodation. No scleral icterus. Extraocular muscles intact.  HEENT: Head atraumatic, normocephalic. Oropharynx and nasopharynx clear.  NECK:  Supple, no jugular venous distention. No thyroid enlargement, no tenderness.  LUNGS: Normal breath sounds bilaterally, no wheezing, rales,rhonchi or crepitation. No use of accessory muscles of respiration.  CARDIOVASCULAR: S1, S2 normal. No rubs, or gallops. 2/6 systolic murmur is heard ABDOMEN: Soft, nontender, nondistended. Bowel sounds present. No organomegaly or mass.  EXTREMITIES: No pedal edema, cyanosis, or clubbing. Bilateral swelling at the knee is noted with some tenderness to touch NEUROLOGIC: Cranial nerves II through XII are intact. Muscle strength 5/5 in all extremities. Sensation intact. Gait not checked. Global weakness present PSYCHIATRIC: The patient is alert and oriented x 2-3.  SKIN: No obvious rash, lesion, or ulcer.   LABORATORY PANEL:   CBC Recent Labs  Lab 04/29/17 0117  WBC 9.7  HGB 13.5  HCT 39.9  PLT 179   ------------------------------------------------------------------------------------------------------------------  Chemistries  Recent Labs  Lab 04/29/17 0116  04/30/17 0443  NA  --    < > 138  K  --    < > 3.6  CL  --    < > 110  CO2  --    < > 21*  GLUCOSE  --    < > 97  BUN  --    < > 26*  CREATININE  --    < > 1.11*  CALCIUM  --    < > 9.3  MG 1.8  --   --    < > = values in this interval not displayed.   ------------------------------------------------------------------------------------------------------------------  Cardiac Enzymes Recent Labs  Lab 04/29/17 0116  TROPONINI 0.05*   ------------------------------------------------------------------------------------------------------------------  RADIOLOGY:  No results found.  EKG:   Orders placed or performed during the hospital encounter of 04/28/17  . ED EKG  . ED EKG  . EKG 12-Lead  . EKG 12-Lead  . EKG  12-Lead  . EKG 12-Lead  . EKG 12-Lead  . EKG 12-Lead  . EKG    ASSESSMENT AND PLAN:   Kristin Brewer  is a 83 y.o. female with a known history of mild cognitive decline, remote history of breast cancer, hypertension, CKD stage II presents to hospital secondary to extensive weakness and almost a fall this morning.  1. Acute cystitis-causing the weakness and falls -Urine cultures growing mixed organisms. -on Rocephin.  physical therapy consult. -Needs home health at discharge  2. Slightly elevated troponin-known history of aortic stenosis. Could be from that and demand ischemia from infection. -Less likely to be myocardial infarction.  -Troponins have plateaued  3. Acute renal failure and hypercalcemia- dehydration and  prerenal causes. Gentle IV hydration, avoid nephrotoxins and monitor. -Improved renal function  4. Hypertension-continue lisinopril and Norvasc.  5. Depression-continue home medications. Patient on Zoloft  6. Dementia-plan was to increase her Aricept as outpatient. We will do that today. Has been having sundowning in the evening. Monitor closely  7. Bilateral knee pain-had a fall at home. No bruising but swelling and  tenderness noted. We'll get x-rays  8. DVT prophylaxis-Lovenox   Physical therapy recommended home health    All the records are reviewed and case discussed with Care Management/Social Workerr. Management plans discussed with the patient, family and they are in agreement.  CODE STATUS: DNR  TOTAL TIME TAKING CARE OF THIS PATIENT: 38 minutes.   POSSIBLE D/C TOMORROW, DEPENDING ON CLINICAL CONDITION.   Sunday Klos M.D on 05/01/2017 at 1:50 PM  Between 7am to 6pm - Pager - [phone]  After 6pm go to [email or web address] - password [email or web address] [facility]  Office  [phone]  CC: Primary care physician; Idelle Crouch, MD

## 2017-05-01 NOTE — Progress Notes (Signed)
Nutrition Follow Up Note   DOCUMENTATION CODES:   Not applicable  INTERVENTION:   Ensure Enlive po BID, each supplement provides 350 kcal and 20 grams of protein  MVI daily  Bowel regimen as needed   NUTRITION DIAGNOSIS:   Inadequate oral intake related to acute illness as evidenced by per patient/family report.  GOAL:   Patient will meet greater than or equal to 90% of their needs  -progressing   MONITOR:   PO intake, Supplement acceptance, Weight trends, Labs, I & O's  ASSESSMENT:    83 y.o. female with a known history of mild cognitive decline, remote history of breast cancer, hypertension, CKD stage II presents to hospital secondary to extensive weakness and almost a fall admitted with UTI   Pt continues to have good appetite and oral intake; pt eating 100% of meals and drinking some Ensure. No new weight since 12/17; recommend obtain new weight.   Medications reviewed and include: aspirin, lovenox, pepcid, Mg oxide, ceftriaxone   Labs reviewed: BUN 26(H), creat 1.11(H) Mg 1.8 wnl- 12/18  Diet Order:  Diet regular Room service appropriate? Yes; Fluid consistency: Thin  EDUCATION NEEDS:   Education needs have been addressed  Skin:  Reviewed RN Assessment  Last BM:  12/19- type 6  Height:   Ht Readings from Last 1 Encounters:  04/28/17 5\' 1"  (1.549 m)    Weight:   Wt Readings from Last 1 Encounters:  04/28/17 121 lb (54.9 kg)    Ideal Body Weight:  47.7 kg  BMI:  Body mass index is 22.86 kg/m.  Estimated Nutritional Needs:   Kcal:  1300-1500kcal/day   Protein:  60-71g/day   Fluid:  >1.3L/day   Koleen Distance MS, RD, LDN Pager #[phone] After Hours Pager: [phone]

## 2017-05-02 ENCOUNTER — Encounter (INDEPENDENT_AMBULATORY_CARE_PROVIDER_SITE_OTHER): Payer: Medicare HMO

## 2017-05-02 ENCOUNTER — Ambulatory Visit (INDEPENDENT_AMBULATORY_CARE_PROVIDER_SITE_OTHER): Payer: Medicare HMO | Admitting: Vascular Surgery

## 2017-05-02 LAB — BASIC METABOLIC PANEL
Anion gap: 9 (ref 5–15)
BUN: 43 mg/dL — ABNORMAL HIGH (ref 6–20)
CALCIUM: 9.6 mg/dL (ref 8.9–10.3)
CO2: 24 mmol/L (ref 22–32)
CREATININE: 1.17 mg/dL — AB (ref 0.44–1.00)
Chloride: 105 mmol/L (ref 101–111)
GFR calc Af Amer: 47 mL/min — ABNORMAL LOW (ref >60)
GFR calc non Af Amer: 40 mL/min — ABNORMAL LOW (ref >60)
GLUCOSE: 116 mg/dL — AB (ref 65–99)
Potassium: 3.8 mmol/L (ref 3.5–5.1)
Sodium: 138 mmol/L (ref 135–145)

## 2017-05-02 MED ORDER — ENSURE ENLIVE PO LIQD: 237.0000 mL | Freq: Two times a day (BID) | ORAL | 12 refills | Status: AC

## 2017-05-02 MED ORDER — CEPHALEXIN 250 MG PO CAPS: 250.0000 mg | ORAL_CAPSULE | Freq: Three times a day (TID) | ORAL | 0 refills | Status: AC

## 2017-05-02 MED ORDER — DONEPEZIL HCL 10 MG PO TABS: 10.0000 mg | ORAL_TABLET | Freq: Every day | ORAL | 1 refills | Status: AC

## 2017-05-02 NOTE — Care Management Note (Signed)
Case Management Note  Patient Details  Name: Kristin Brewer MRN: [id number] Date of Birth: 08/24/1926   Patient to discharge home today.  [name] with [facility] notified of discharge.  RNCM Signing off.   Subjective/Objective:                    Action/Plan:   Expected Discharge Date:  05/02/17               Expected Discharge Plan:  [place]  In-House Referral:     Discharge planning Services  CM Consult  Post Acute Care Choice:    Choice offered to:  Patient  DME Arranged:    DME Agency:     HH Arranged:  RN, PT, OT HH Agency:  [facility]  Status of Service:  Completed, signed off  If discussed at [place] of Stay Meetings, dates discussed:    Additional Comments:  Beverly Sessions, RN 05/02/2017, 12:48 PM

## 2017-05-02 NOTE — Progress Notes (Signed)
OT Cancellation Note  Patient Details Name: Kristin Brewer MRN: [id number] DOB: 04-20-1927   Cancelled Treatment:    Reason Eval/Treat Not Completed: Other (comment). Order received, chart reviewed. Pt with discharge order and discharge summary from physician already in place. Case management has set up home health services (RN, PT, and OT). Pt discharging home today. Spoke with RN. Will hold OT evaluation. Pt discharging imminently. Will complete OT order. Please re-consult if additional needs arise.   Jeni Salles, MPH, MS, OTR/L ascom [phone] 05/02/17, 2:56 PM

## 2017-05-02 NOTE — Progress Notes (Signed)
Patient discharge teaching given, including activity, diet, follow-up appoints, and medications. Patient verbalized understanding of all discharge instructions. HH setup by case Freight forwarder. IV access was d/c'd. Vitals are stable. Skin is intact except as charted in most recent assessments. Pt to be escorted out by volunteer, to be driven home by family.  Bayley Hurn [employer]

## 2017-05-02 NOTE — Progress Notes (Signed)
Physical Therapy Treatment Patient Details Name: Kristin Brewer MRN: [id number] DOB: 1927/03/18 Today's Date: 05/02/2017    History of Present Illness presented to ER secondary to progressive weakness, fall in home environment; admitted with acute cystitis/UTI.  Noted with mild elevation in troponin (0.06), likely due to AS/demand ischemia per chart.    PT Comments    Pt presented in bed agreeable to therapy. Pt continues to be limited by pain in L knee particularly. Pt ambulated to bathroom and able to perform toilet transfers with minA. Pt participated in stair training with use of L rail to simulate per pt L ledge at home. Pt able to perform 16 stairs side stepping with step to pattern. Pt then able to ambulate additional 33ft with min guard and good tolerance. Pt would continue to benefit from skilled PT to improve RLE strengthening and improve overall functional mobility tolerance.    Follow Up Recommendations  Home health PT;Supervision/Assistance - 24 hour     Equipment Recommendations  Rolling walker with 5" wheels    Recommendations for Other Services       Precautions / Restrictions Precautions Precautions: Fall Restrictions Weight Bearing Restrictions: No    Mobility  Bed Mobility Overal bed mobility: Needs Assistance Bed Mobility: Supine to Sit;Sit to Supine     Supine to sit: Supervision Sit to supine: Supervision      Transfers Overall transfer level: Needs assistance Equipment used: Rolling walker (2 wheeled) Transfers: Sit to/from Stand Sit to Stand: Min assist         General transfer comment: increased time, cues for increasing anterior wt shift, cues for Brewer placement with RW  Ambulation/Gait Ambulation/Gait assistance: Min guard Ambulation Distance (Feet): 40 Feet Assistive device: Rolling walker (2 wheeled) Gait Pattern/deviations: Step-through pattern;Decreased stance time - left;Decreased stride length   Gait velocity interpretation: <1.8  ft/sec, indicative of risk for recurrent falls General Gait Details: decreased wt bearing through LLE   Stairs Stairs: Yes   Stair Management: One rail Left;Step to pattern;Sideways Number of Stairs: 16 General stair comments: cues for sequencing  Wheelchair Mobility    Modified Rankin (Stroke Patients Only)       Balance Overall balance assessment: Needs assistance Sitting-balance support: No upper extremity supported Sitting balance-Leahy Scale: Good     Standing balance support: Bilateral upper extremity supported Standing balance-Leahy Scale: Fair Standing balance comment: Use of RW                            Cognition Arousal/Alertness: Awake/alert Behavior During Therapy: WFL for tasks assessed/performed Overall Cognitive Status: Within Functional Limits for tasks assessed                                        Exercises      General Comments        Pertinent Vitals/Pain Pain Assessment: 0-10 Faces Pain Scale: Hurts whole lot Pain Location: L knee Pain Descriptors / Indicators: Aching;Constant Pain Intervention(s): Limited activity within patient's tolerance    Home Living                      Prior Function            PT Goals (current goals can now be found in the care plan section) Acute Rehab PT Goals Patient Stated Goal: to return home PT Goal  Formulation: With patient Time For Goal Achievement: 05/13/17 Potential to Achieve Goals: Good Progress towards PT goals: Progressing toward goals    Frequency    Min 2X/week      PT Plan Current plan remains appropriate    Co-evaluation              AM-PAC PT "6 Clicks" Daily Activity  Outcome Measure  Difficulty turning over in bed (including adjusting bedclothes, sheets and blankets)?: A Little Difficulty moving from lying on back to sitting on the side of the bed? : A Lot Difficulty sitting down on and standing up from a chair with arms  (e.g., wheelchair, bedside commode, etc,.)?: Unable Help needed moving to and from a bed to chair (including a wheelchair)?: A Little Help needed walking in hospital room?: A Little Help needed climbing 3-5 steps with a railing? : A Little 6 Click Score: 15    End of Session Equipment Utilized During Treatment: Gait belt Activity Tolerance: Patient tolerated treatment well;Patient limited by pain Patient left: in bed;with call bell/phone within reach;with bed alarm set Nurse Communication: Mobility status PT Visit Diagnosis: Muscle weakness (generalized) (M62.81);History of falling (Z91.81);Difficulty in walking, not elsewhere classified (R26.2);Pain Pain - Right/Left: Left Pain - part of body: Knee     Time: 1105-1140 PT Time Calculation (min) (ACUTE ONLY): 35 min  Charges:  $Therapeutic Activity: 23-37 mins                       Aadhav Uhlig  Yaniyah Koors, PTA 05/02/2017, 12:40 PM

## 2017-05-02 NOTE — Discharge Summary (Signed)
[name] NAME: Kristin Brewer    MR#:  [id number]  DATE OF BIRTH:  06-Apr-1927  DATE OF ADMISSION:  04/28/2017   ADMITTING PHYSICIAN: Gladstone Lighter, MD  DATE OF DISCHARGE:  05/02/17  PRIMARY CARE PHYSICIAN: Idelle Crouch, MD   ADMISSION DIAGNOSIS:   Dehydration [E86.0] Encephalopathy [G93.40] Acute cystitis without hematuria [N30.00]  DISCHARGE DIAGNOSIS:   Active Problems:   Encephalopathy   SECONDARY DIAGNOSIS:   Past Medical History:  Diagnosis Date  . Aortic stenosis   . Breast cancer ([place])   . CKD (chronic kidney disease), stage II   . Dementia   . Frequent falls   . Hypertension     HOSPITAL COURSE:   Kristin Brewer a83 y.o.femalewith a known history of mild cognitive decline, remote history of breast cancer, hypertension, CKD stage II presents to hospital secondary to extensive weakness and almost a fall this morning.  1.Acute cystitis-causing the weakness and falls -Urine cultures growing mixed organisms. -on Rocephin.   will be discharged on Keflex to finish off the course. -physical therapy consulted. -Needs home health at discharge  2. Slightly elevated troponin-known history of aortic stenosis. Could be from that and demand ischemia from infection. -not myocardial infarction.  -Troponins have plateaued  3. Acute renal failure and hypercalcemia- dehydration and  prerenal causes. Gentle IV hydration, avoid nephrotoxins and monitor. -Improved renal function  4. Hypertension-continue metoprolol Not taking lisinopril and Norvasc at this time  5. Depression-continue home medications. Patient on Zoloft  6. Dementia-plan was to increase her Aricept as outpatient. We will do that prior to discharge.Marland Kitchen Has been having sundowning in the evening. Monitor closely  7. Bilateral knee pain-had a fall at home. No bruising but swelling and  tenderness noted. X-rays with arthritis changes,  prosthesis in place from previous surgeries. No acute findings. -Outpatient orthopedic follow-up if she needs any steroid injection  Stable for discharge today    DISCHARGE CONDITIONS:   Guarded  CONSULTS OBTAINED:   None  DRUG ALLERGIES:   Allergies  Allergen Reactions  . Penicillins     Has patient had a PCN reaction causing immediate rash, facial/tongue/throat swelling, SOB or lightheadedness with hypotension: Unknown Has patient had a PCN reaction causing severe rash involving mucus membranes or skin necrosis: Unknown Has patient had a PCN reaction that required hospitalization: No Has patient had a PCN reaction occurring within the last 10 years: No If all of the above answers are "NO", then may proceed with Cephalosporin use.   . Ciprofloxacin Rash   DISCHARGE MEDICATIONS:   Allergies as of 05/02/2017      Reactions   Penicillins    Has patient had a PCN reaction causing immediate rash, facial/tongue/throat swelling, SOB or lightheadedness with hypotension: Unknown Has patient had a PCN reaction causing severe rash involving mucus membranes or skin necrosis: Unknown Has patient had a PCN reaction that required hospitalization: No Has patient had a PCN reaction occurring within the last 10 years: No If all of the above answers are "NO", then may proceed with Cephalosporin use.   Ciprofloxacin Rash      Medication List    STOP taking these medications   amLODipine 5 MG tablet Commonly known as:  NORVASC   atorvastatin 40 MG tablet Commonly known as:  LIPITOR   escitalopram 10 MG tablet Commonly known as:  LEXAPRO   indapamide 2.5 MG tablet Commonly known as:  LOZOL   lisinopril 10 MG tablet  Commonly known as:  PRINIVIL,ZESTRIL   potassium chloride SA 20 MEQ tablet Commonly known as:  K-DUR,KLOR-CON     TAKE these medications   aspirin 81 MG EC tablet Take 81 mg by mouth daily.   cephALEXin 250 MG capsule Commonly known as:  KEFLEX Take 1  capsule (250 mg total) by mouth 3 (three) times daily for 3 days. X 3 more days   CRANBERRY PO Take 2 tablets by mouth daily.   docusate sodium 100 MG capsule Commonly known as:  COLACE Take 100 mg by mouth daily as needed for mild constipation.   donepezil 10 MG tablet Commonly known as:  ARICEPT Take 1 tablet (10 mg total) by mouth at bedtime. What changed:    medication strength  how much to take   feeding supplement (ENSURE ENLIVE) Liqd Take 237 mLs by mouth 2 (two) times daily between meals.   HYDROcodone-acetaminophen 5-325 MG tablet Commonly known as:  NORCO/VICODIN Take 1 tablet by mouth every 6 (six) hours as needed. for pain   LORazepam 1 MG tablet Commonly known as:  ATIVAN Take 1 tablet (1 mg total) by mouth at bedtime as needed.   magnesium oxide 400 (241.3 Mg) MG tablet Commonly known as:  MAG-OX Take 1 tablet by mouth daily.   metoprolol succinate 100 MG 24 hr tablet Commonly known as:  TOPROL-XL TAKE 1 TABLET (100 MG TOTAL) BY MOUTH ONCE DAILY.   ranitidine 150 MG tablet Commonly known as:  ZANTAC TAKE 1 TABLET (150 MG TOTAL) BY MOUTH 2 (TWO) TIMES DAILY.   sertraline 50 MG tablet Commonly known as:  ZOLOFT Take 1 tablet by mouth at bedtime.   tolterodine 4 MG 24 hr capsule Commonly known as:  DETROL LA Take 4 mg by mouth daily.   Vitamin D (Ergocalciferol) 50000 units Caps capsule Commonly known as:  DRISDOL TAKE 1 CAPSULE BY MOUTH EVERY MONTH            Durable Medical Equipment  (From admission, onward)        Start     Ordered   04/29/17 1547  For home use only DME Walker rolling  Once    Question:  Patient needs a walker to treat with the following condition  Answer:  Weakness generalized   04/29/17 1549       DISCHARGE INSTRUCTIONS:   1. PCP follow-up in 1-2 weeks 2. Orthopedic follow-up in 1-2 weeks  DIET:   Cardiac diet  ACTIVITY:   Activity as tolerated  OXYGEN:   Home Oxygen: No.  Oxygen Delivery: room  air  DISCHARGE LOCATION:   home   If you experience worsening of your admission symptoms, develop shortness of breath, life threatening emergency, suicidal or homicidal thoughts you must seek medical attention immediately by calling 911 or calling your MD immediately  if symptoms less severe.  You Must read complete instructions/literature along with all the possible adverse reactions/side effects for all the Medicines you take and that have been prescribed to you. Take any new Medicines after you have completely understood and accpet all the possible adverse reactions/side effects.   Please note  You were cared for by a hospitalist during your hospital stay. If you have any questions about your discharge medications or the care you received while you were in the hospital after you are discharged, you can call the unit and asked to speak with the hospitalist on call if the hospitalist that took care of you is not available. Once you  are discharged, your primary care physician will handle any further medical issues. Please note that NO REFILLS for any discharge medications will be authorized once you are discharged, as it is imperative that you return to your primary care physician (or establish a relationship with a primary care physician if you do not have one) for your aftercare needs so that they can reassess your need for medications and monitor your lab values.    On the day of Discharge:  VITAL SIGNS:   Blood pressure 109/61, pulse (!) 53, temperature 97.6 F (36.4 C), temperature source Oral, resp. rate 18, height 5\' 1"  (1.549 m), weight 54.9 kg (121 lb), SpO2 97 %.  PHYSICAL EXAMINATION:     GENERAL:83 y.o.-year-oldelderlypatient lying in the bed with no acute distress.  EYES: Pupils equal, round, reactive to light and accommodation. No scleral icterus. Extraocular muscles intact.  HEENT: Head atraumatic, normocephalic. Oropharynx and nasopharynx clear.  NECK: Supple, no  jugular venous distention. No thyroid enlargement, no tenderness.  LUNGS: Normal breath sounds bilaterally, no wheezing, rales,rhonchi or crepitation. No use of accessory muscles of respiration.  CARDIOVASCULAR: S1, S2 normal. No rubs, or gallops.2/6 systolic murmur is heard ABDOMEN: Soft, nontender, nondistended. Bowel sounds present. No organomegaly or mass.  EXTREMITIES: No pedal edema, cyanosis, or clubbing.Bilateral swelling at the knee is noted with some tenderness to touch NEUROLOGIC: Cranial nerves II through XII are intact. Muscle strength 5/5 in all extremities. Sensation intact. Gait not checked.Global weakness present PSYCHIATRIC: The patient is alert and oriented x 2-3.  SKIN: No obvious rash, lesion, or ulcer.    DATA REVIEW:   CBC Recent Labs  Lab 04/29/17 0117  WBC 9.7  HGB 13.5  HCT 39.9  PLT 179    Chemistries  Recent Labs  Lab 04/29/17 0116  05/02/17 0351  NA  --    < > 138  K  --    < > 3.8  CL  --    < > 105  CO2  --    < > 24  GLUCOSE  --    < > 116*  BUN  --    < > 43*  CREATININE  --    < > 1.17*  CALCIUM  --    < > 9.6  MG 1.8  --   --    < > = values in this interval not displayed.     Microbiology Results  Results for orders placed or performed during the hospital encounter of 04/28/17  Urine Culture     Status: Abnormal   Collection Time: 04/28/17  7:46 AM  Result Value Ref Range Status   Specimen Description URINE, RANDOM  Final   Special Requests NONE  Final   Culture MULTIPLE SPECIES PRESENT, SUGGEST RECOLLECTION (A)  Final   Report Status 04/29/2017 FINAL  Final  Urine Culture     Status: Abnormal   Collection Time: 04/30/17  2:36 PM  Result Value Ref Range Status   Specimen Description URINE, CLEAN CATCH  Final   Special Requests NONE  Final   Culture (A)  Final    <10,000 COLONIES/mL INSIGNIFICANT GROWTH Performed at [facility], [place]. [address] [place], [place] [address]    Report Status 05/01/2017 FINAL  Final     RADIOLOGY:  Dg Knee Complete 4 Views Left  Result Date: 05/01/2017 CLINICAL DATA:  Several week history of bilateral knee pain greater on the left. History previous bilateral knee joint replacement. EXAM: RIGHT KNEE -  COMPLETE 4+ VIEW; LEFT KNEE - COMPLETE 4+ VIEW COMPARISON:  Right knee series dated November 26, 2016 FINDINGS: Right knee: The prosthesis appears appropriately positioned. The interface with the native bone is normal. There is no acute native bone fracture. There is no joint effusion. There is calcification in the wall of the superficial femoral artery and the popliteal artery. Left knee: The interface of the prosthesis with the native bone appears normal. There are no findings suspicious for loosening or infection. There is no joint effusion. There is calcification in the distal superficial femoral artery and popliteal artery. IMPRESSION: Bilateral knee joint prosthesis placement. No findings suspicious for loosening or infection. No acute native bone abnormalities. Electronically Signed   By: David  Martinique M.D.   On: 05/01/2017 14:34   Dg Knee Complete 4 Views Right  Result Date: 05/01/2017 CLINICAL DATA:  Several week history of bilateral knee pain greater on the left. History previous bilateral knee joint replacement. EXAM: RIGHT KNEE - COMPLETE 4+ VIEW; LEFT KNEE - COMPLETE 4+ VIEW COMPARISON:  Right knee series dated November 26, 2016 FINDINGS: Right knee: The prosthesis appears appropriately positioned. The interface with the native bone is normal. There is no acute native bone fracture. There is no joint effusion. There is calcification in the wall of the superficial femoral artery and the popliteal artery. Left knee: The interface of the prosthesis with the native bone appears normal. There are no findings suspicious for loosening or infection. There is no joint effusion. There is calcification in the distal superficial femoral artery and popliteal artery. IMPRESSION: Bilateral knee  joint prosthesis placement. No findings suspicious for loosening or infection. No acute native bone abnormalities. Electronically Signed   By: David  Martinique M.D.   On: 05/01/2017 14:34     Management plans discussed with the patient, family and they are in agreement.  CODE STATUS:     Code Status Orders  (From admission, onward)        Start     Ordered   04/28/17 1320  Do not attempt resuscitation (DNR)  Continuous    Question Answer Comment  In the event of cardiac or respiratory ARREST Do not call a "code blue"   In the event of cardiac or respiratory ARREST Do not perform Intubation, CPR, defibrillation or ACLS   In the event of cardiac or respiratory ARREST Use medication by any route, position, wound care, and other measures to relive pain and suffering. May use oxygen, suction and manual treatment of airway obstruction as needed for comfort.      04/28/17 1319    Code Status History    Date Active Date Inactive Code Status Order ID Comments User Context   11/27/2016 00:18 12/03/2016 19:50 DNR [id number]  Vaughan Basta, MD Inpatient      TOTAL TIME TAKING CARE OF THIS PATIENT: 38 minutes.    Gladstone Lighter M.D on 05/02/2017 at 12:37 PM  Between 7am to 6pm - Pager - [phone]  After 6pm go to [email or web address] - Proofreader  [facility]  Office  [phone]  CC: Primary care physician; Idelle Crouch, MD   Note: This dictation was prepared with Dragon dictation along with smaller phrase technology. Any transcriptional errors that result from this process are unintentional.

## 2017-08-11 ENCOUNTER — Inpatient Hospital Stay
Admission: EM | Admit: 2017-08-11 | Discharge: 2017-08-14 | DRG: 690 | Disposition: A | Discharge status: Home Health | Payer: Medicare HMO | Attending: Internal Medicine | Admitting: Internal Medicine

## 2017-08-11 ENCOUNTER — Other Ambulatory Visit: Payer: Self-pay

## 2017-08-11 DIAGNOSIS — E860 Dehydration: Secondary | ICD-10-CM | POA: Diagnosis present

## 2017-08-11 DIAGNOSIS — R7989 Other specified abnormal findings of blood chemistry: Secondary | ICD-10-CM

## 2017-08-11 DIAGNOSIS — I248 Other forms of acute ischemic heart disease: Secondary | ICD-10-CM | POA: Diagnosis present

## 2017-08-11 DIAGNOSIS — R778 Other specified abnormalities of plasma proteins: Secondary | ICD-10-CM

## 2017-08-11 DIAGNOSIS — E872 Acidosis, unspecified: Secondary | ICD-10-CM

## 2017-08-11 DIAGNOSIS — N390 Urinary tract infection, site not specified: Secondary | ICD-10-CM | POA: Diagnosis not present

## 2017-08-11 DIAGNOSIS — N189 Chronic kidney disease, unspecified: Secondary | ICD-10-CM

## 2017-08-11 DIAGNOSIS — N182 Chronic kidney disease, stage 2 (mild): Secondary | ICD-10-CM | POA: Diagnosis present

## 2017-08-11 DIAGNOSIS — I2510 Atherosclerotic heart disease of native coronary artery without angina pectoris: Secondary | ICD-10-CM | POA: Diagnosis present

## 2017-08-11 DIAGNOSIS — F329 Major depressive disorder, single episode, unspecified: Secondary | ICD-10-CM | POA: Diagnosis present

## 2017-08-11 DIAGNOSIS — E876 Hypokalemia: Secondary | ICD-10-CM | POA: Diagnosis present

## 2017-08-11 DIAGNOSIS — I129 Hypertensive chronic kidney disease with stage 1 through stage 4 chronic kidney disease, or unspecified chronic kidney disease: Secondary | ICD-10-CM | POA: Diagnosis present

## 2017-08-11 DIAGNOSIS — I350 Nonrheumatic aortic (valve) stenosis: Secondary | ICD-10-CM | POA: Diagnosis present

## 2017-08-11 DIAGNOSIS — I739 Peripheral vascular disease, unspecified: Secondary | ICD-10-CM | POA: Diagnosis present

## 2017-08-11 DIAGNOSIS — Z880 Allergy status to penicillin: Secondary | ICD-10-CM

## 2017-08-11 DIAGNOSIS — Z881 Allergy status to other antibiotic agents status: Secondary | ICD-10-CM

## 2017-08-11 DIAGNOSIS — F0390 Unspecified dementia without behavioral disturbance: Secondary | ICD-10-CM | POA: Diagnosis present

## 2017-08-11 DIAGNOSIS — R296 Repeated falls: Secondary | ICD-10-CM | POA: Diagnosis present

## 2017-08-11 DIAGNOSIS — Z6822 Body mass index (BMI) 22.0-22.9, adult: Secondary | ICD-10-CM

## 2017-08-11 DIAGNOSIS — E890 Postprocedural hypothyroidism: Secondary | ICD-10-CM | POA: Diagnosis present

## 2017-08-11 DIAGNOSIS — Z7982 Long term (current) use of aspirin: Secondary | ICD-10-CM

## 2017-08-11 DIAGNOSIS — R319 Hematuria, unspecified: Secondary | ICD-10-CM | POA: Diagnosis present

## 2017-08-11 DIAGNOSIS — Z66 Do not resuscitate: Secondary | ICD-10-CM | POA: Diagnosis present

## 2017-08-11 DIAGNOSIS — B960 Mycoplasma pneumoniae [M. pneumoniae] as the cause of diseases classified elsewhere: Secondary | ICD-10-CM | POA: Diagnosis not present

## 2017-08-11 DIAGNOSIS — N1330 Unspecified hydronephrosis: Secondary | ICD-10-CM | POA: Diagnosis present

## 2017-08-11 DIAGNOSIS — D649 Anemia, unspecified: Secondary | ICD-10-CM | POA: Diagnosis not present

## 2017-08-11 DIAGNOSIS — N179 Acute kidney failure, unspecified: Secondary | ICD-10-CM | POA: Diagnosis present

## 2017-08-11 DIAGNOSIS — Z79899 Other long term (current) drug therapy: Secondary | ICD-10-CM

## 2017-08-11 DIAGNOSIS — A419 Sepsis, unspecified organism: Secondary | ICD-10-CM

## 2017-08-11 DIAGNOSIS — R112 Nausea with vomiting, unspecified: Secondary | ICD-10-CM

## 2017-08-11 DIAGNOSIS — E669 Obesity, unspecified: Secondary | ICD-10-CM | POA: Diagnosis present

## 2017-08-11 DIAGNOSIS — Z853 Personal history of malignant neoplasm of breast: Secondary | ICD-10-CM

## 2017-08-11 DIAGNOSIS — R197 Diarrhea, unspecified: Secondary | ICD-10-CM

## 2017-08-11 MED ORDER — ONDANSETRON HCL 4 MG/2ML IJ SOLN
4.0000 mg | Freq: Once | INTRAMUSCULAR | Status: AC
  Administered 2017-08-12: 4 mg via INTRAVENOUS
  Filled 2017-08-11: qty 2

## 2017-08-11 MED ORDER — SODIUM CHLORIDE 0.9 % IV BOLUS
1000.0000 mL | Freq: Once | INTRAVENOUS | Status: AC
  Administered 2017-08-12: 1000 mL via INTRAVENOUS

## 2017-08-11 NOTE — ED Triage Notes (Signed)
Pt c/o abd pain and vomiting today

## 2017-08-12 ENCOUNTER — Other Ambulatory Visit: Payer: Self-pay

## 2017-08-12 ENCOUNTER — Emergency Department: Payer: Medicare HMO

## 2017-08-12 DIAGNOSIS — F329 Major depressive disorder, single episode, unspecified: Secondary | ICD-10-CM | POA: Diagnosis present

## 2017-08-12 DIAGNOSIS — Z66 Do not resuscitate: Secondary | ICD-10-CM | POA: Diagnosis present

## 2017-08-12 DIAGNOSIS — F0390 Unspecified dementia without behavioral disturbance: Secondary | ICD-10-CM | POA: Diagnosis present

## 2017-08-12 DIAGNOSIS — E876 Hypokalemia: Secondary | ICD-10-CM | POA: Diagnosis present

## 2017-08-12 DIAGNOSIS — I129 Hypertensive chronic kidney disease with stage 1 through stage 4 chronic kidney disease, or unspecified chronic kidney disease: Secondary | ICD-10-CM | POA: Diagnosis present

## 2017-08-12 DIAGNOSIS — E890 Postprocedural hypothyroidism: Secondary | ICD-10-CM | POA: Diagnosis present

## 2017-08-12 DIAGNOSIS — E872 Acidosis: Secondary | ICD-10-CM | POA: Diagnosis present

## 2017-08-12 DIAGNOSIS — I2510 Atherosclerotic heart disease of native coronary artery without angina pectoris: Secondary | ICD-10-CM | POA: Diagnosis present

## 2017-08-12 DIAGNOSIS — N182 Chronic kidney disease, stage 2 (mild): Secondary | ICD-10-CM | POA: Diagnosis present

## 2017-08-12 DIAGNOSIS — N390 Urinary tract infection, site not specified: Secondary | ICD-10-CM | POA: Diagnosis present

## 2017-08-12 DIAGNOSIS — B960 Mycoplasma pneumoniae [M. pneumoniae] as the cause of diseases classified elsewhere: Secondary | ICD-10-CM | POA: Diagnosis not present

## 2017-08-12 DIAGNOSIS — I248 Other forms of acute ischemic heart disease: Secondary | ICD-10-CM | POA: Diagnosis present

## 2017-08-12 DIAGNOSIS — R319 Hematuria, unspecified: Secondary | ICD-10-CM | POA: Diagnosis present

## 2017-08-12 DIAGNOSIS — Z7982 Long term (current) use of aspirin: Secondary | ICD-10-CM | POA: Diagnosis not present

## 2017-08-12 DIAGNOSIS — Z6822 Body mass index (BMI) 22.0-22.9, adult: Secondary | ICD-10-CM | POA: Diagnosis not present

## 2017-08-12 DIAGNOSIS — N179 Acute kidney failure, unspecified: Secondary | ICD-10-CM | POA: Diagnosis present

## 2017-08-12 DIAGNOSIS — R296 Repeated falls: Secondary | ICD-10-CM | POA: Diagnosis present

## 2017-08-12 DIAGNOSIS — E860 Dehydration: Secondary | ICD-10-CM | POA: Diagnosis present

## 2017-08-12 DIAGNOSIS — D649 Anemia, unspecified: Secondary | ICD-10-CM | POA: Diagnosis not present

## 2017-08-12 DIAGNOSIS — E669 Obesity, unspecified: Secondary | ICD-10-CM | POA: Diagnosis present

## 2017-08-12 DIAGNOSIS — Z853 Personal history of malignant neoplasm of breast: Secondary | ICD-10-CM | POA: Diagnosis not present

## 2017-08-12 DIAGNOSIS — I739 Peripheral vascular disease, unspecified: Secondary | ICD-10-CM | POA: Diagnosis present

## 2017-08-12 DIAGNOSIS — N1330 Unspecified hydronephrosis: Secondary | ICD-10-CM | POA: Diagnosis present

## 2017-08-12 DIAGNOSIS — I350 Nonrheumatic aortic (valve) stenosis: Secondary | ICD-10-CM | POA: Diagnosis present

## 2017-08-12 LAB — URINALYSIS, COMPLETE (UACMP) WITH MICROSCOPIC
Bilirubin Urine: NEGATIVE
Glucose, UA: NEGATIVE mg/dL
Ketones, ur: NEGATIVE mg/dL
Nitrite: NEGATIVE
Protein, ur: 100 mg/dL — AB
Specific Gravity, Urine: 1.013 (ref 1.005–1.030)
Squamous Epithelial / HPF: NONE SEEN
pH: 6 (ref 5.0–8.0)

## 2017-08-12 LAB — CBC
HEMATOCRIT: 39.1 % (ref 35.0–47.0)
HEMOGLOBIN: 12.6 g/dL (ref 12.0–16.0)
MCH: 28.6 pg (ref 26.0–34.0)
MCHC: 32.2 g/dL (ref 32.0–36.0)
MCV: 88.9 fL (ref 80.0–100.0)
Platelets: 129 10*3/uL — ABNORMAL LOW (ref 150–440)
RBC: 4.39 MIL/uL (ref 3.80–5.20)
RDW: 14.1 % (ref 11.5–14.5)
WBC: 19.6 10*3/uL — AB (ref 3.6–11.0)

## 2017-08-12 LAB — CBC WITH DIFFERENTIAL/PLATELET
BASOS ABS: 0 10*3/uL (ref 0–0.1)
BASOS PCT: 0 %
EOS ABS: 0 10*3/uL (ref 0–0.7)
Eosinophils Relative: 0 %
HEMATOCRIT: 45.2 % (ref 35.0–47.0)
Hemoglobin: 14.6 g/dL (ref 12.0–16.0)
Lymphocytes Relative: 2 %
Lymphs Abs: 0.3 10*3/uL — ABNORMAL LOW (ref 1.0–3.6)
MCH: 28.7 pg (ref 26.0–34.0)
MCHC: 32.3 g/dL (ref 32.0–36.0)
MCV: 88.9 fL (ref 80.0–100.0)
MONO ABS: 0.3 10*3/uL (ref 0.2–0.9)
Monocytes Relative: 2 %
NEUTROS ABS: 16.2 10*3/uL — AB (ref 1.4–6.5)
NEUTROS PCT: 96 %
Platelets: 150 10*3/uL (ref 150–440)
RBC: 5.08 MIL/uL (ref 3.80–5.20)
RDW: 14.2 % (ref 11.5–14.5)
WBC: 16.9 10*3/uL — ABNORMAL HIGH (ref 3.6–11.0)

## 2017-08-12 LAB — PROTIME-INR
INR: 1.13
Prothrombin Time: 14.4 seconds (ref 11.4–15.2)

## 2017-08-12 LAB — COMPREHENSIVE METABOLIC PANEL WITH GFR
ALT: 24 U/L (ref 14–54)
AST: 41 U/L (ref 15–41)
Albumin: 3.9 g/dL (ref 3.5–5.0)
Alkaline Phosphatase: 76 U/L (ref 38–126)
Anion gap: 14 (ref 5–15)
BUN: 38 mg/dL — ABNORMAL HIGH (ref 6–20)
CO2: 23 mmol/L (ref 22–32)
Calcium: 9.4 mg/dL (ref 8.9–10.3)
Chloride: 104 mmol/L (ref 101–111)
Creatinine, Ser: 1.79 mg/dL — ABNORMAL HIGH (ref 0.44–1.00)
GFR calc Af Amer: 28 mL/min — ABNORMAL LOW
GFR calc non Af Amer: 24 mL/min — ABNORMAL LOW
Glucose, Bld: 158 mg/dL — ABNORMAL HIGH (ref 65–99)
Potassium: 3.2 mmol/L — ABNORMAL LOW (ref 3.5–5.1)
Sodium: 141 mmol/L (ref 135–145)
Total Bilirubin: 1.1 mg/dL (ref 0.3–1.2)
Total Protein: 7 g/dL (ref 6.5–8.1)

## 2017-08-12 LAB — LACTIC ACID, PLASMA
Lactic Acid, Venous: 3.8 mmol/L (ref 0.5–1.9)
Lactic Acid, Venous: 1.3 mmol/L (ref 0.5–1.9)

## 2017-08-12 LAB — TSH: TSH: 0.4 u[IU]/mL (ref 0.350–4.500)

## 2017-08-12 LAB — CREATININE, SERUM
Creatinine, Ser: 1.91 mg/dL — ABNORMAL HIGH (ref 0.44–1.00)
GFR calc Af Amer: 26 mL/min — ABNORMAL LOW (ref >60)
GFR, EST NON AFRICAN AMERICAN: 22 mL/min — AB (ref >60)

## 2017-08-12 LAB — TROPONIN I
TROPONIN I: 1.16 ng/mL — AB (ref <0.03)
TROPONIN I: 0.79 ng/mL — AB (ref <0.03)
Troponin I: 0.89 ng/mL
Troponin I: 1.52 ng/mL (ref <0.03)

## 2017-08-12 LAB — APTT: aPTT: 33 seconds (ref 24–36)

## 2017-08-12 LAB — HEPARIN LEVEL (UNFRACTIONATED): Heparin Unfractionated: 0.34 IU/mL (ref 0.30–0.70)

## 2017-08-12 LAB — LIPASE, BLOOD: Lipase: 50 U/L (ref 11–51)

## 2017-08-12 MED ORDER — FLEET ENEMA 7-19 GM/118ML RE ENEM: 1.0000 | ENEMA | Freq: Once | RECTAL | Status: DC | PRN

## 2017-08-12 MED ORDER — ASPIRIN EC 325 MG PO TBEC
325.0000 mg | DELAYED_RELEASE_TABLET | Freq: Every day | ORAL | Status: DC
  Administered 2017-08-12 – 2017-08-14 (×3): 325 mg via ORAL
  Filled 2017-08-12 (×3): qty 1

## 2017-08-12 MED ORDER — SODIUM CHLORIDE 0.9 % IV SOLN
1.0000 g | INTRAVENOUS | Status: DC
  Administered 2017-08-13 – 2017-08-14 (×2): 1 g via INTRAVENOUS
  Filled 2017-08-12 (×3): qty 10

## 2017-08-12 MED ORDER — FESOTERODINE FUMARATE ER 4 MG PO TB24
4.0000 mg | ORAL_TABLET | Freq: Every day | ORAL | Status: DC
  Administered 2017-08-12 – 2017-08-14 (×2): 4 mg via ORAL
  Filled 2017-08-12 (×4): qty 1

## 2017-08-12 MED ORDER — ONDANSETRON HCL 4 MG PO TABS: 4.0000 mg | ORAL_TABLET | Freq: Four times a day (QID) | ORAL | Status: DC | PRN

## 2017-08-12 MED ORDER — DONEPEZIL HCL 5 MG PO TABS
10.0000 mg | ORAL_TABLET | Freq: Every day | ORAL | Status: DC
  Administered 2017-08-12 – 2017-08-13 (×2): 10 mg via ORAL
  Filled 2017-08-12 (×4): qty 2

## 2017-08-12 MED ORDER — HYDRALAZINE HCL 20 MG/ML IJ SOLN
10.0000 mg | INTRAMUSCULAR | Status: DC | PRN
Start: 2017-08-12 — End: 2017-08-14

## 2017-08-12 MED ORDER — HEPARIN SODIUM (PORCINE) 5000 UNIT/ML IJ SOLN
5000.0000 [IU] | Freq: Three times a day (TID) | INTRAMUSCULAR | Status: DC
  Administered 2017-08-12: 5000 [IU] via SUBCUTANEOUS
  Filled 2017-08-12: qty 1

## 2017-08-12 MED ORDER — MORPHINE SULFATE (PF) 2 MG/ML IV SOLN
2.0000 mg | INTRAVENOUS | Status: DC | PRN
  Administered 2017-08-12: 2 mg via INTRAVENOUS
  Filled 2017-08-12: qty 1

## 2017-08-12 MED ORDER — ACETAMINOPHEN 650 MG RE SUPP: 650.0000 mg | Freq: Four times a day (QID) | RECTAL | Status: DC | PRN

## 2017-08-12 MED ORDER — ENSURE ENLIVE PO LIQD
237.0000 mL | Freq: Two times a day (BID) | ORAL | Status: DC
  Administered 2017-08-13 – 2017-08-14 (×3): 237 mL via ORAL

## 2017-08-12 MED ORDER — SODIUM CHLORIDE 0.9 % IV BOLUS
1000.0000 mL | Freq: Once | INTRAVENOUS | Status: AC
  Administered 2017-08-12: 1000 mL via INTRAVENOUS

## 2017-08-12 MED ORDER — DOCUSATE SODIUM 100 MG PO CAPS: 100.0000 mg | ORAL_CAPSULE | Freq: Every day | ORAL | Status: DC | PRN

## 2017-08-12 MED ORDER — DOCUSATE SODIUM 100 MG PO CAPS
100.0000 mg | ORAL_CAPSULE | Freq: Two times a day (BID) | ORAL | Status: DC
  Administered 2017-08-12 – 2017-08-14 (×4): 100 mg via ORAL
  Filled 2017-08-12 (×5): qty 1

## 2017-08-12 MED ORDER — ACETAMINOPHEN 325 MG PO TABS: 650.0000 mg | ORAL_TABLET | Freq: Four times a day (QID) | ORAL | Status: DC | PRN

## 2017-08-12 MED ORDER — FAMOTIDINE 20 MG PO TABS
20.0000 mg | ORAL_TABLET | Freq: Every day | ORAL | Status: DC
  Administered 2017-08-12 – 2017-08-14 (×3): 20 mg via ORAL
  Filled 2017-08-12 (×3): qty 1

## 2017-08-12 MED ORDER — ONDANSETRON HCL 4 MG/2ML IJ SOLN
4.0000 mg | Freq: Four times a day (QID) | INTRAMUSCULAR | Status: DC | PRN
Start: 2017-08-12 — End: 2017-08-14

## 2017-08-12 MED ORDER — SODIUM CHLORIDE 0.9 % IV SOLN
Freq: Once | INTRAVENOUS | Status: AC
  Administered 2017-08-12: 03:00:00 via INTRAVENOUS

## 2017-08-12 MED ORDER — BISACODYL 10 MG RE SUPP: 10.0000 mg | Freq: Every day | RECTAL | Status: DC | PRN

## 2017-08-12 MED ORDER — IOPAMIDOL (ISOVUE-300) INJECTION 61%: 15.0000 mL | INTRAVENOUS | Status: DC

## 2017-08-12 MED ORDER — POLYETHYLENE GLYCOL 3350 17 G PO PACK: 17.0000 g | PACK | Freq: Every day | ORAL | Status: DC | PRN

## 2017-08-12 MED ORDER — MAGNESIUM OXIDE 400 (241.3 MG) MG PO TABS
400.0000 mg | ORAL_TABLET | Freq: Every day | ORAL | Status: DC
  Administered 2017-08-12 – 2017-08-14 (×3): 400 mg via ORAL
  Filled 2017-08-12 (×3): qty 1

## 2017-08-12 MED ORDER — KCL IN DEXTROSE-NACL 40-5-0.45 MEQ/L-%-% IV SOLN
INTRAVENOUS | Status: DC
  Administered 2017-08-12: 07:00:00 via INTRAVENOUS
  Filled 2017-08-12 (×3): qty 1000

## 2017-08-12 MED ORDER — NITROGLYCERIN 2 % TD OINT
0.5000 [in_us] | TOPICAL_OINTMENT | Freq: Three times a day (TID) | TRANSDERMAL | Status: DC
  Administered 2017-08-12 – 2017-08-14 (×5): 0.5 [in_us] via TOPICAL
  Filled 2017-08-12 (×6): qty 1

## 2017-08-12 MED ORDER — ASPIRIN EC 81 MG PO TBEC
81.0000 mg | DELAYED_RELEASE_TABLET | Freq: Every day | ORAL | Status: DC
  Administered 2017-08-12: 81 mg via ORAL
  Filled 2017-08-12: qty 1

## 2017-08-12 MED ORDER — SODIUM CHLORIDE 0.9 % IV SOLN
1.0000 g | Freq: Once | INTRAVENOUS | Status: AC
  Administered 2017-08-12: 1 g via INTRAVENOUS
  Filled 2017-08-12: qty 10

## 2017-08-12 MED ORDER — HYDROCODONE-ACETAMINOPHEN 5-325 MG PO TABS
1.0000 | ORAL_TABLET | Freq: Four times a day (QID) | ORAL | Status: DC | PRN
  Administered 2017-08-12 – 2017-08-13 (×3): 1 via ORAL
  Filled 2017-08-12 (×2): qty 1

## 2017-08-12 MED ORDER — INDAPAMIDE 2.5 MG PO TABS
2.5000 mg | ORAL_TABLET | Freq: Every day | ORAL | Status: DC
  Administered 2017-08-12 – 2017-08-14 (×3): 2.5 mg via ORAL
  Filled 2017-08-12 (×3): qty 1

## 2017-08-12 MED ORDER — SERTRALINE HCL 50 MG PO TABS: 50.0000 mg | ORAL_TABLET | Freq: Every day | ORAL | Status: DC

## 2017-08-12 MED ORDER — LORAZEPAM 0.5 MG PO TABS
0.5000 mg | ORAL_TABLET | Freq: Every day | ORAL | Status: DC
  Administered 2017-08-12 – 2017-08-13 (×2): 0.5 mg via ORAL
  Filled 2017-08-12 (×2): qty 1

## 2017-08-12 MED ORDER — ATORVASTATIN CALCIUM 20 MG PO TABS
20.0000 mg | ORAL_TABLET | Freq: Every day | ORAL | Status: DC
  Administered 2017-08-12 – 2017-08-13 (×2): 20 mg via ORAL
  Filled 2017-08-12 (×2): qty 1

## 2017-08-12 MED ORDER — METOPROLOL SUCCINATE ER 100 MG PO TB24
100.0000 mg | ORAL_TABLET | Freq: Every day | ORAL | Status: DC
  Administered 2017-08-13 – 2017-08-14 (×2): 100 mg via ORAL
  Filled 2017-08-12 (×3): qty 1

## 2017-08-12 MED ORDER — HEPARIN (PORCINE) IN NACL 100-0.45 UNIT/ML-% IJ SOLN
1000.0000 [IU]/h | INTRAMUSCULAR | Status: DC
  Administered 2017-08-12: 700 [IU]/h via INTRAVENOUS
  Filled 2017-08-12: qty 250

## 2017-08-12 MED ORDER — KCL IN DEXTROSE-NACL 20-5-0.45 MEQ/L-%-% IV SOLN
INTRAVENOUS | Status: DC
  Administered 2017-08-12 – 2017-08-13 (×2): via INTRAVENOUS
  Filled 2017-08-12 (×5): qty 1000

## 2017-08-12 NOTE — Consult Note (Signed)
Reason for Consult: Elevated troponins possible non-STEMI Referring Physician: Dr. Jerelyn Charles hospitalist Dr. Georgie Chard primary  Kristin Brewer is an 84 y.o. female.  HPI: Patient was admitted with emesis nausea vomiting was found to have urinary tract infection treated with Bactrim patient also has some mild lower abdominal discomfort during her workup she was found to have borderline troponins low potassium elevated creatinine and elevated lactic acid with elevated white count.  CT of the abdomen was benign patient was admitted with renal insufficiency and mild dehydration because of elevated troponin cardiology consultation was recommended patient denies any chest pain has had frequent falls denies any previous cardiac history  Past Medical History:  Diagnosis Date  . Aortic stenosis   . Breast cancer ([place])   . CKD (chronic kidney disease), stage II   . Dementia   . Frequent falls   . Hypertension     Past Surgical History:  Procedure Laterality Date  . BREAST LUMPECTOMY    . CHOLECYSTECTOMY    . KNEE SURGERY Bilateral   . THYROIDECTOMY      Family History  Problem Relation Age of Onset  . CAD Mother   . CAD Father     Social History:  reports that she has never smoked. She has never used smokeless tobacco. She reports that she drinks alcohol. She reports that she does not use drugs.  Allergies:  Allergies  Allergen Reactions  . Penicillins     Has patient had a PCN reaction causing immediate rash, facial/tongue/throat swelling, SOB or lightheadedness with hypotension: Unknown Has patient had a PCN reaction causing severe rash involving mucus membranes or skin necrosis: Unknown Has patient had a PCN reaction that required hospitalization: No Has patient had a PCN reaction occurring within the last 10 years: No If all of the above answers are "NO", then may proceed with Cephalosporin use.   . Ciprofloxacin Rash    Medications: I have reviewed the patient's current  medications.  Results for orders placed or performed during the hospital encounter of 08/11/17 (from the past 48 hour(s))  CBC with Differential     Status: Abnormal   Collection Time: 08/11/17 11:54 PM  Result Value Ref Range   WBC 16.9 (H) 3.6 - 11.0 K/uL   RBC 5.08 3.80 - 5.20 MIL/uL   Hemoglobin 14.6 12.0 - 16.0 g/dL   HCT 45.2 35.0 - 47.0 %   MCV 88.9 80.0 - 100.0 fL   MCH 28.7 26.0 - 34.0 pg   MCHC 32.3 32.0 - 36.0 g/dL   RDW 14.2 11.5 - 14.5 %   Platelets 150 150 - 440 K/uL   Neutrophils Relative % 96 %   Neutro Abs 16.2 (H) 1.4 - 6.5 K/uL   Lymphocytes Relative 2 %   Lymphs Abs 0.3 (L) 1.0 - 3.6 K/uL   Monocytes Relative 2 %   Monocytes Absolute 0.3 0.2 - 0.9 K/uL   Eosinophils Relative 0 %   Eosinophils Absolute 0.0 0 - 0.7 K/uL   Basophils Relative 0 %   Basophils Absolute 0.0 0 - 0.1 K/uL    Comment: Performed at [facility], [address] [place], [place] [address]  Comprehensive metabolic panel     Status: Abnormal   Collection Time: 08/11/17 11:54 PM  Result Value Ref Range   Sodium 141 135 - 145 mmol/L   Potassium 3.2 (L) 3.5 - 5.1 mmol/L   Chloride 104 101 - 111 mmol/L   CO2 23 22 - 32 mmol/L  Glucose, Bld 158 (H) 65 - 99 mg/dL   BUN 38 (H) 6 - 20 mg/dL   Creatinine, Ser 1.79 (H) 0.44 - 1.00 mg/dL   Calcium 9.4 8.9 - 10.3 mg/dL   Total Protein 7.0 6.5 - 8.1 g/dL   Albumin 3.9 3.5 - 5.0 g/dL   AST 41 15 - 41 U/L   ALT 24 14 - 54 U/L   Alkaline Phosphatase 76 38 - 126 U/L   Total Bilirubin 1.1 0.3 - 1.2 mg/dL   GFR calc non Af Amer 24 (L) >60 mL/min   GFR calc Af Amer 28 (L) >60 mL/min    Comment: (NOTE) The eGFR has been calculated using the CKD EPI equation. This calculation has not been validated in all clinical situations. eGFR's persistently <60 mL/min signify possible Chronic Kidney Disease.    Anion gap 14 5 - 15    Comment: Performed at [facility], [address] [place], [place] [address]  Lipase, blood     Status:  None   Collection Time: 08/11/17 11:54 PM  Result Value Ref Range   Lipase 50 11 - 51 U/L    Comment: Performed at [facility], [address] [place], [place] [address]  Troponin I     Status: Abnormal   Collection Time: 08/11/17 11:54 PM  Result Value Ref Range   Troponin I 0.89 (HH) <0.03 ng/mL    Comment: CRITICAL RESULT CALLED TO, READ BACK BY AND VERIFIED WITH VANESSA ASHLEY RN AT 5852 08/12/17. MSS Performed at [facility], [address] [place], [place] [address]   Urinalysis, Complete w Microscopic     Status: Abnormal   Collection Time: 08/11/17 11:55 PM  Result Value Ref Range   Color, Urine YELLOW (A) YELLOW   APPearance CLOUDY (A) CLEAR   Specific Gravity, Urine 1.013 1.005 - 1.030   pH 6.0 5.0 - 8.0   Glucose, UA NEGATIVE NEGATIVE mg/dL   Hgb urine dipstick LARGE (A) NEGATIVE   Bilirubin Urine NEGATIVE NEGATIVE   Ketones, ur NEGATIVE NEGATIVE mg/dL   Protein, ur 100 (A) NEGATIVE mg/dL   Nitrite NEGATIVE NEGATIVE   Leukocytes, UA SMALL (A) NEGATIVE   RBC / HPF TOO NUMEROUS TO COUNT 0 - 5 RBC/hpf   WBC, UA TOO NUMEROUS TO COUNT 0 - 5 WBC/hpf   Bacteria, UA RARE (A) NONE SEEN   Squamous Epithelial / LPF NONE SEEN NONE SEEN   WBC Clumps PRESENT     Comment: Performed at [facility], [address] [place], [place] [address]  Lactic acid, plasma     Status: Abnormal   Collection Time: 08/12/17  1:55 AM  Result Value Ref Range   Lactic Acid, Venous 3.8 (HH) 0.5 - 1.9 mmol/L    Comment: CRITICAL RESULT CALLED TO, READ BACK BY AND VERIFIED WITH VANESSA ASHLEY RN AT 1443 08/12/17. MSS Performed at [facility], [place], [place] [address]   Lactic acid, plasma     Status: None   Collection Time: 08/12/17  8:15 AM  Result Value Ref Range   Lactic Acid, Venous 1.3 0.5 - 1.9 mmol/L    Comment: Performed at [facility], [address] [place],  [address]  CBC     Status: Abnormal   Collection  Time: 08/12/17  8:15 AM  Result Value Ref Range   WBC 19.6 (H) 3.6 - 11.0 K/uL   RBC 4.39 3.80 - 5.20 MIL/uL   Hemoglobin 12.6  12.0 - 16.0 g/dL   HCT 39.1 35.0 - 47.0 %   MCV 88.9 80.0 - 100.0 fL   MCH 28.6 26.0 - 34.0 pg   MCHC 32.2 32.0 - 36.0 g/dL   RDW 14.1 11.5 - 14.5 %   Platelets 129 (L) 150 - 440 K/uL    Comment: Performed at [facility], [address] [place], [place] [address]  Creatinine, serum     Status: Abnormal   Collection Time: 08/12/17  8:15 AM  Result Value Ref Range   Creatinine, Ser 1.91 (H) 0.44 - 1.00 mg/dL   GFR calc non Af Amer 22 (L) >60 mL/min   GFR calc Af Amer 26 (L) >60 mL/min    Comment: (NOTE) The eGFR has been calculated using the CKD EPI equation. This calculation has not been validated in all clinical situations. eGFR's persistently <60 mL/min signify possible Chronic Kidney Disease. Performed at [facility], [address] [place], [place] [address]   Troponin I     Status: Abnormal   Collection Time: 08/12/17  8:15 AM  Result Value Ref Range   Troponin I 1.52 (HH) <0.03 ng/mL    Comment: CRITICAL VALUE NOTED. VALUE IS CONSISTENT WITH PREVIOUSLY REPORTED/CALLED VALUE/HKP Performed at [facility], [address] [place], [place] [address]   TSH     Status: None   Collection Time: 08/12/17  8:15 AM  Result Value Ref Range   TSH 0.400 0.350 - 4.500 uIU/mL    Comment: Performed by a 3rd Generation assay with a functional sensitivity of <=0.01 uIU/mL. Performed at [facility], [address] [place], [place] [address]   Troponin I     Status: Abnormal   Collection Time: 08/12/17 10:38 AM  Result Value Ref Range   Troponin I 1.16 (HH) <0.03 ng/mL    Comment: CRITICAL VALUE NOTED. VALUE IS CONSISTENT WITH PREVIOUSLY REPORTED/CALLED VALUE/HKP Performed at [facility], [address] [place], [place] [address]   Protime-INR     Status: None   Collection Time: 08/12/17 11:35 AM   Result Value Ref Range   Prothrombin Time 14.4 11.4 - 15.2 seconds   INR 1.13     Comment: Performed at [facility], [address] [place], [place] [address]  APTT     Status: None   Collection Time: 08/12/17 11:35 AM  Result Value Ref Range   aPTT 33 24 - 36 seconds    Comment: Performed at [facility], [address] [place], [place] [address]  Troponin I     Status: Abnormal   Collection Time: 08/12/17  4:40 PM  Result Value Ref Range   Troponin I 0.79 (HH) <0.03 ng/mL    Comment: CRITICAL VALUE NOTED. VALUE IS CONSISTENT WITH PREVIOUSLY REPORTED/CALLED VALUE JJB Performed at [facility], [place], [place] [address]   Heparin level (unfractionated)     Status: None   Collection Time: 08/12/17  7:44 PM  Result Value Ref Range   Heparin Unfractionated 0.34 0.30 - 0.70 IU/mL    Comment:        IF HEPARIN RESULTS ARE BELOW EXPECTED VALUES, AND PATIENT DOSAGE HAS BEEN CONFIRMED, SUGGEST FOLLOW UP TESTING OF ANTITHROMBIN III LEVELS. Performed at [facility], [place], [place] [address]     Ct Abdomen Pelvis Wo Contrast  Result Date: 08/12/2017 CLINICAL DATA:  84 year old female with nausea vomiting. EXAM: CT ABDOMEN AND PELVIS WITHOUT CONTRAST TECHNIQUE: Multidetector  CT imaging of the abdomen and pelvis was performed following the standard protocol without IV contrast. COMPARISON:  Abdominal CT dated 04/28/2017 FINDINGS: Evaluation of this exam is limited in the absence of intravenous contrast. Lower chest: The visualized lung bases are clear. Multi vessel coronary vascular calcification as well as calcification of the mitral annulus and aortic valve. No intra-abdominal free air or free fluid. Hepatobiliary: The liver is unremarkable. Prior cholecystectomy with air in the CBD and mild pneumobilia. No retained stone noted in the central CBD. Pancreas: Unremarkable. No pancreatic ductal dilatation or surrounding  inflammatory changes. Spleen: Normal in size without focal abnormality. Adrenals/Urinary Tract: The adrenal glands are unremarkable. 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 There is a right extrarenal pelvis with mild chronic pelvicaliectasis similar to prior CT. No stone. There is no hydronephrosis or nephrolithiasis on the left. The visualized ureters and urinary bladder appear unremarkable. Stomach/Bowel: There is sigmoid diverticulosis with muscular hypertrophy. No active inflammatory changes. There is no bowel obstruction or active inflammation. The appendix is not visualized with certainty. No inflammatory changes identified in the right lower quadrant. Vascular/Lymphatic: Advanced aortoiliac atherosclerotic disease with focal area of high-grade narrowing of the lumen of the aorta at the level of the renal arteries. This is similar to prior CT. Evaluation of the vasculature is limited in the absence of intravenous contrast. The IVC is unremarkable. No portal venous gas. There is no adenopathy. Reproductive: Hysterectomy.  No pelvic mass. Other: None Musculoskeletal: There is osteopenia with degenerative changes of the spine. No acute osseous pathology. IMPRESSION: 1. Sigmoid diverticulosis. No bowel obstruction or active inflammation. 2. Right extrarenal pelvis with chronic mild pelvicaliectasis similar to prior CT. No renal calculi. 3. Advanced Aortic Atherosclerosis (ICD10-I70.0). Electronically Signed   By: Anner Crete M.D.   On: 08/12/2017 02:05    Review of Systems  Constitutional: Positive for malaise/fatigue.  HENT: Positive for congestion.   Eyes: Negative.   Respiratory: Positive for shortness of breath.   Cardiovascular: Positive for palpitations, orthopnea and leg swelling.  Gastrointestinal: Negative.   Genitourinary: Positive for dysuria, frequency and urgency.  Musculoskeletal: Negative.   Neurological: Negative.   Endo/Heme/Allergies: Negative.    Psychiatric/Behavioral: Negative.    Blood pressure (!) 140/44, pulse (!) 58, temperature 98.3 F (36.8 C), temperature source Oral, resp. rate 18, height _0  (1.6 m), weight 125 lb (56.7 kg), SpO2 97 %. Physical Exam  Nursing note and vitals reviewed. Constitutional: She is oriented to person, place, and time. She appears well-developed and well-nourished.  HENT:  Head: Normocephalic and atraumatic.  Eyes: Pupils are equal, round, and reactive to light. Conjunctivae and EOM are normal.  Neck: Normal range of motion. Neck supple.  Cardiovascular: Normal rate, regular rhythm and normal heart sounds.  Respiratory: Effort normal. She has decreased breath sounds. She has rhonchi.  Musculoskeletal: Normal range of motion.  Neurological: She is alert and oriented to person, place, and time. She has normal reflexes.  Skin: Skin is warm and dry.  Psychiatric:  dementia    Assessment/Plan: Aortic stenosis Breast cancer Chronic renal insufficiency stage III Dementia Frequent falls Hypertension Mild obesity Urinary  tract infection Elevated troponin Hypokalemia . Plan We will with telemetry F/U up EKGs and troponins Continue antibiotic therapy for urinary tract infection Refer the patient to nephrology for renal insufficiency Correct electrolytes Elevated white count infection therapy with antibiotics Continue hypertension management control Consider psychiatry and neurology for dementia Agree with echocardiogram for further assessment murmur aortic stenosis Demand ischemia doubt  non-STEMI    Dwayne D Callwood 08/12/2017, 9:23 PM

## 2017-08-12 NOTE — Progress Notes (Signed)
Patient arrived from [facility].  Patient alert and oriented, vss, no complaints of pain.  NSR on monitor on box 23.  Patient able to ambulate to [facility] with walker and standby assist.   Patient very upset that she was not told why she was being transferred to a different floor.  She states she called to use the bathroom and they took her to a completely different floor.

## 2017-08-12 NOTE — H&P (Signed)
[facility] [place] NAME: Kristin Brewer    MR#:  [id number]  DATE OF BIRTH:  [birth year]  DATE OF ADMISSION:  08/11/2017  PRIMARY CARE PHYSICIAN: Idelle Crouch, MD   REQUESTING/REFERRING PHYSICIAN:   CHIEF COMPLAINT:   Chief Complaint  Patient presents with  . Emesis    HISTORY OF PRESENT ILLNESS: Kristin Brewer  is a 84 y.o. female with a known history per below, currently under treatment for UTI by primary care provider with Bactrim, presenting with nausea/vomiting/lower abdominal pain to the emergency room, workup noted for abnormal urinalysis concerning for UTI, elevated troponin 0.89, potassium 3.2, creatinine 1.7, lactic acid 3.8, white count 16.9, CT abdomen unimpressive, patient evaluated in the emergency room, patient is demented and is a poor historian, patient is now been noted for acute UTI currently on outpatient treatment with Bactrim, acute kidney injury with chronic kidney disease stage II, and acute dehydration.  PAST MEDICAL HISTORY:   Past Medical History:  Diagnosis Date  . Aortic stenosis   . Breast cancer ([place])   . CKD (chronic kidney disease), stage II   . Dementia   . Frequent falls   . Hypertension     PAST SURGICAL HISTORY:  Past Surgical History:  Procedure Laterality Date  . BREAST LUMPECTOMY    . CHOLECYSTECTOMY    . KNEE SURGERY Bilateral   . THYROIDECTOMY      SOCIAL HISTORY:  Social History   Tobacco Use  . Smoking status: Never Smoker  . Smokeless tobacco: Never Used  Substance Use Topics  . Alcohol use: Yes    FAMILY HISTORY:  Family History  Problem Relation Age of Onset  . CAD Mother   . CAD Father     DRUG ALLERGIES:  Allergies  Allergen Reactions  . Penicillins     Has patient had a PCN reaction causing immediate rash, facial/tongue/throat swelling, SOB or lightheadedness with hypotension: Unknown Has patient had a PCN reaction causing severe rash involving mucus membranes or  skin necrosis: Unknown Has patient had a PCN reaction that required hospitalization: No Has patient had a PCN reaction occurring within the last 10 years: No If all of the above answers are "NO", then may proceed with Cephalosporin use.   . Ciprofloxacin Rash    REVIEW OF SYSTEMS: Unable to be obtained given dementia/confusion  CONSTITUTIONAL: No fever, fatigue or weakness.  EYES: No blurred or double vision.  EARS, NOSE, AND THROAT: No tinnitus or ear pain.  RESPIRATORY: No cough, shortness of breath, wheezing or hemoptysis.  CARDIOVASCULAR: No chest pain, orthopnea, edema.  GASTROINTESTINAL: + nausea, vomiting, abdominal pain.  GENITOURINARY: No dysuria, hematuria.  ENDOCRINE: No polyuria, nocturia,  HEMATOLOGY: No anemia, easy bruising or bleeding SKIN: No rash or lesion. MUSCULOSKELETAL: No joint pain or arthritis.   NEUROLOGIC: No tingling, numbness, weakness.  PSYCHIATRY: No anxiety or depression.   MEDICATIONS AT HOME:  Prior to Admission medications   Medication Sig Start Date End Date Taking? Authorizing Provider  aspirin 81 MG EC tablet Take 81 mg by mouth daily. 03/31/16  Yes [provider]  B Complex-C (B-COMPLEX WITH VITAMIN C) tablet Take 1 tablet by mouth daily.   Yes [provider]  docusate sodium (COLACE) 100 MG capsule Take 100 mg by mouth daily as needed for mild constipation.   Yes [provider]  donepezil (ARICEPT) 10 MG tablet Take 1 tablet (10 mg total) by mouth at bedtime. 05/02/17  Yes Kalisetti,  Hart Rochester, MD  HYDROcodone-acetaminophen (NORCO/VICODIN) 5-325 MG tablet Take 1 tablet by mouth every 6 (six) hours as needed. for pain 03/19/17  Yes [provider]  indapamide (LOZOL) 2.5 MG tablet Take 2.5 mg by mouth daily.   Yes [provider]  LORazepam (ATIVAN) 1 MG tablet Take 1 tablet (1 mg total) by mouth at bedtime as needed. 12/03/16  Yes Max Sane, MD  magnesium oxide (MAG-OX) 400 (241.3 Mg) MG tablet  Take 1 tablet by mouth daily. 03/25/16  Yes [provider]  metoprolol succinate (TOPROL-XL) 100 MG 24 hr tablet TAKE 1 TABLET (100 MG TOTAL) BY MOUTH ONCE DAILY. 05/31/16  Yes [provider]  ranitidine (ZANTAC) 150 MG tablet TAKE 1 TABLET (150 MG TOTAL) BY MOUTH 2 (TWO) TIMES DAILY. 03/31/16  Yes [provider]  Vitamin D, Ergocalciferol, (DRISDOL) 50000 units CAPS capsule TAKE 1 CAPSULE BY MOUTH EVERY MONTH 06/01/16  Yes [provider]  CRANBERRY PO Take 2 tablets by mouth daily.    [provider]  feeding supplement, ENSURE ENLIVE, (ENSURE ENLIVE) LIQD Take 237 mLs by mouth 2 (two) times daily between meals. 05/02/17   Gladstone Lighter, MD  sertraline (ZOLOFT) 50 MG tablet Take 1 tablet by mouth at bedtime.  04/02/17   [provider]  tolterodine (DETROL LA) 4 MG 24 hr capsule Take 4 mg by mouth daily. 05/31/16   [provider]      PHYSICAL EXAMINATION:   VITAL SIGNS: Blood pressure (!) 119/45, pulse 62, temperature 98.2 F (36.8 C), resp. rate 18, height 5\' 3"  (1.6 m), weight 54.9 kg (121 lb), SpO2 94 %.  GENERAL:  84 y.o.-year-old patient lying in the bed with no acute distress.  Frail-appearing EYES: Pupils equal, round, reactive to light and accommodation. No scleral icterus. Extraocular muscles intact.  HEENT: Head atraumatic, normocephalic. Oropharynx and nasopharynx clear.  Dry mucous membranes NECK:  Supple, no jugular venous distention. No thyroid enlargement, no tenderness.  Poor skin turgor LUNGS: Normal breath sounds bilaterally, no wheezing, rales,rhonchi or crepitation. No use of accessory muscles of respiration.   CARDIOVASCULAR: S1, S2 normal. No murmurs, rubs, or gallops.  ABDOMEN: Soft, nontender, nondistended. Bowel sounds present. No organomegaly or mass.  EXTREMITIES: No pedal edema, cyanosis, or clubbing.  NEUROLOGIC: Cranial nerves II through XII are intact. Muscle strength 5/5 in all extremities.  Sensation intact. Gait not checked.  PSYCHIATRIC: The patient is confused and disoriented  SKIN: No obvious rash, lesion, or ulcer.   LABORATORY PANEL:   CBC Recent Labs  Lab 08/11/17 2354  WBC 16.9*  HGB 14.6  HCT 45.2  PLT 150  MCV 88.9  MCH 28.7  MCHC 32.3  RDW 14.2  LYMPHSABS 0.3*  MONOABS 0.3  EOSABS 0.0  BASOSABS 0.0   ------------------------------------------------------------------------------------------------------------------  Chemistries  Recent Labs  Lab 08/11/17 2354  NA 141  K 3.2*  CL 104  CO2 23  GLUCOSE 158*  BUN 38*  CREATININE 1.79*  CALCIUM 9.4  AST 41  ALT 24  ALKPHOS 76  BILITOT 1.1   ------------------------------------------------------------------------------------------------------------------ estimated creatinine clearance is 17.6 mL/min (A) (by C-G formula based on SCr of 1.79 mg/dL (H)). ------------------------------------------------------------------------------------------------------------------ No results for input(s): TSH, T4TOTAL, T3FREE, THYROIDAB in the last 72 hours.  Invalid input(s): FREET3   Coagulation profile No results for input(s): INR, PROTIME in the last 168 hours. ------------------------------------------------------------------------------------------------------------------- No results for input(s): DDIMER in the last 72 hours. -------------------------------------------------------------------------------------------------------------------  Cardiac Enzymes Recent Labs  Lab 08/11/17 2354  TROPONINI 0.89*   ------------------------------------------------------------------------------------------------------------------  Invalid input(s): POCBNP  ---------------------------------------------------------------------------------------------------------------  Urinalysis    Component Value Date/Time   COLORURINE YELLOW (A) 08/11/2017 2355   APPEARANCEUR CLOUDY (A) 08/11/2017 2355   LABSPEC 1.013  08/11/2017 2355   PHURINE 6.0 08/11/2017 2355   GLUCOSEU NEGATIVE 08/11/2017 2355   HGBUR LARGE (A) 08/11/2017 2355   BILIRUBINUR NEGATIVE 08/11/2017 2355   KETONESUR NEGATIVE 08/11/2017 2355   PROTEINUR 100 (A) 08/11/2017 2355   NITRITE NEGATIVE 08/11/2017 2355   LEUKOCYTESUR SMALL (A) 08/11/2017 2355     RADIOLOGY: Ct Abdomen Pelvis Wo Contrast  Result Date: 08/12/2017 CLINICAL DATA:  84 year old female with nausea vomiting. EXAM: CT ABDOMEN AND PELVIS WITHOUT CONTRAST TECHNIQUE: Multidetector CT imaging of the abdomen and pelvis was performed following the standard protocol without IV contrast. COMPARISON:  Abdominal CT dated 04/28/2017 FINDINGS: Evaluation of this exam is limited in the absence of intravenous contrast. Lower chest: The visualized lung bases are clear. Multi vessel coronary vascular calcification as well as calcification of the mitral annulus and aortic valve. No intra-abdominal free air or free fluid. Hepatobiliary: The liver is unremarkable. Prior cholecystectomy with air in the CBD and mild pneumobilia. No retained stone noted in the central CBD. Pancreas: Unremarkable. No pancreatic ductal dilatation or surrounding inflammatory changes. Spleen: Normal in size without focal abnormality. Adrenals/Urinary Tract: The adrenal glands are unremarkable. 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 There is a right extrarenal pelvis with mild chronic pelvicaliectasis similar to prior CT. No stone. There is no hydronephrosis or nephrolithiasis on the left. The visualized ureters and urinary bladder appear unremarkable. Stomach/Bowel: There is sigmoid diverticulosis with muscular hypertrophy. No active inflammatory changes. There is no bowel obstruction or active inflammation. The appendix is not visualized with certainty. No inflammatory changes identified in the right lower quadrant. Vascular/Lymphatic: Advanced aortoiliac atherosclerotic disease with focal area of high-grade  narrowing of the lumen of the aorta at the level of the renal arteries. This is similar to prior CT. Evaluation of the vasculature is limited in the absence of intravenous contrast. The IVC is unremarkable. No portal venous gas. There is no adenopathy. Reproductive: Hysterectomy.  No pelvic mass. Other: None Musculoskeletal: There is osteopenia with degenerative changes of the spine. No acute osseous pathology. IMPRESSION: 1. Sigmoid diverticulosis. No bowel obstruction or active inflammation. 2. Right extrarenal pelvis with chronic mild pelvicaliectasis similar to prior CT. No renal calculi. 3. Advanced Aortic Atherosclerosis (ICD10-I70.0). Electronically Signed   By: Anner Crete M.D.   On: 08/12/2017 02:05    EKG: Orders placed or performed during the hospital encounter of 08/11/17  . ED EKG  . ED EKG    IMPRESSION AND PLAN: [name] a89 y.o.femalewith a known history of dementia, remote history of breast cancer, hypertension, CKD stage II presents to hospital due to nausea, vomiting, UTI which is failed outpatient management   1 acute urinary tract infection Failed outpatient management with Bactrim Regular nursing for bed, empiric Rocephin, IV fluids for rehydration, follow-up on cultures  2 acute kidney injury with chronic kidney disease stage III IV fluids for rehydration, avoid nephrotoxic agents, BMP in the morning  3 acute elevated troponins Suspect related to demand ischemia from infection Compounded by aortic stenosis Continue to cycle cardiac enzymes, continue aspirin, beta-blocker therapy, nitrates as needed, IV morphine for breakthrough pain supplemental oxygen, consult cardiology for expert opinion  4 chronic benign essential hypertension Stable Continue metoprolol  5 chronic dementia Stable Continue Aricept, increase nursing care, aspiration/fall precautions while in house  6 chronic depression Stable  Continue Zoloft   All the records are  reviewed and case discussed with ED provider. Management plans discussed with the patient, family and they are in agreement.  CODE STATUS:full Code Status History    Date Active Date Inactive Code Status Order ID Comments User Context   04/28/2017 1319 05/02/2017 1857 DNR [id number]  Gladstone Lighter, MD Inpatient   11/27/2016 0018 12/03/2016 1950 DNR [id number]  Vaughan Basta, MD Inpatient    Questions for Most Recent Historical Code Status (Order [id number])    Question Answer Comment   In the event of cardiac or respiratory ARREST Do not call a "code blue"    In the event of cardiac or respiratory ARREST Do not perform Intubation, CPR, defibrillation or ACLS    In the event of cardiac or respiratory ARREST Use medication by any route, position, wound care, and other measures to relive pain and suffering. May use oxygen, suction and manual treatment of airway obstruction as needed for comfort.        TOTAL TIME TAKING CARE OF THIS PATIENT: 45 minutes.    Avel Peace Catriona Dillenbeck M.D on 08/12/2017   Between 7am to 6pm - Pager - [phone]  After 6pm go to [email or web address] - password [email or web address] [facility]  Office  [phone]  CC: Primary care physician; Idelle Crouch, MD   Note: This dictation was prepared with Dragon dictation along with smaller phrase technology. Any transcriptional errors that result from this process are unintentional.

## 2017-08-12 NOTE — ED Provider Notes (Signed)
[facility] Emergency Department Provider Note   ____________________________________________   First MD Initiated Contact with Patient 08/11/17 2353     (approximate)  I have reviewed the triage vital signs and the nursing notes.   HISTORY  Chief Complaint Emesis  Patient with history of dementia and unable to accurately answer questions.  HPI Kristin Brewer is a 84 y.o. female who comes into the hospital today because she started feeling bad this morning.  The patient according to EMS has had some diarrhea and weakness.  The family states that she also did have some vomiting.  They thought that it may be a UTI.  Dr. Doy Hutching called in a prescription for Bactrim.  The patient though has continued to not feel well.  She started having some increased vomiting this evening.  The patient reports that she had a little bit of vomiting earlier today.  The family states that she did not eat or drink much today.  She denies any abdominal pain or chest pain.  Her emesis is greenish in appearance.  She has some mild dementia but is unable to tell me the date.  The patient is here today for evaluation.  Her family was not with her when she initially arrived.   Past Medical History:  Diagnosis Date  . Aortic stenosis   . Breast cancer ([place])   . CKD (chronic kidney disease), stage II   . Dementia   . Frequent falls   . Hypertension     Patient Active Problem List   Diagnosis Date Noted  . Encephalopathy 04/28/2017  . Fall 11/27/2016  . Dementia 11/27/2016  . Hypokalemia 11/27/2016  . Leukocytosis 11/27/2016  . CKD (chronic kidney disease), stage II 11/27/2016  . Elevated troponin 11/26/2016  . Rhabdomyolysis 11/26/2016  . Essential hypertension, benign 06/14/2016  . Carotid artery disease ([place]) 06/14/2016  . Aortic atherosclerosis ([place]) 06/14/2016  . PVD (peripheral vascular disease) ([place]) 06/14/2016    Past Surgical History:  Procedure Laterality Date  .  BREAST LUMPECTOMY    . CHOLECYSTECTOMY    . KNEE SURGERY Bilateral   . THYROIDECTOMY      Prior to Admission medications   Medication Sig Start Date End Date Taking? Authorizing Provider  aspirin 81 MG EC tablet Take 81 mg by mouth daily. 03/31/16  Yes [provider]  B Complex-C (B-COMPLEX WITH VITAMIN C) tablet Take 1 tablet by mouth daily.   Yes [provider]  docusate sodium (COLACE) 100 MG capsule Take 100 mg by mouth daily as needed for mild constipation.   Yes [provider]  donepezil (ARICEPT) 10 MG tablet Take 1 tablet (10 mg total) by mouth at bedtime. 05/02/17  Yes Gladstone Lighter, MD  HYDROcodone-acetaminophen (NORCO/VICODIN) 5-325 MG tablet Take 1 tablet by mouth every 6 (six) hours as needed. for pain 03/19/17  Yes [provider]  indapamide (LOZOL) 2.5 MG tablet Take 2.5 mg by mouth daily.   Yes [provider]  LORazepam (ATIVAN) 1 MG tablet Take 1 tablet (1 mg total) by mouth at bedtime as needed. 12/03/16  Yes Max Sane, MD  magnesium oxide (MAG-OX) 400 (241.3 Mg) MG tablet Take 1 tablet by mouth daily. 03/25/16  Yes [provider]  metoprolol succinate (TOPROL-XL) 100 MG 24 hr tablet TAKE 1 TABLET (100 MG TOTAL) BY MOUTH ONCE DAILY. 05/31/16  Yes [provider]  ranitidine (ZANTAC) 150 MG tablet TAKE 1 TABLET (150 MG TOTAL) BY MOUTH 2 (TWO) TIMES DAILY.  03/31/16  Yes [provider]  Vitamin D, Ergocalciferol, (DRISDOL) 50000 units CAPS capsule TAKE 1 CAPSULE BY MOUTH EVERY MONTH 06/01/16  Yes [provider]  CRANBERRY PO Take 2 tablets by mouth daily.    [provider]  feeding supplement, ENSURE ENLIVE, (ENSURE ENLIVE) LIQD Take 237 mLs by mouth 2 (two) times daily between meals. 05/02/17   Gladstone Lighter, MD  sertraline (ZOLOFT) 50 MG tablet Take 1 tablet by mouth at bedtime.  04/02/17   [provider]  tolterodine (DETROL LA) 4 MG 24 hr capsule Take 4 mg by  mouth daily. 05/31/16   [provider]    Allergies Penicillins and Ciprofloxacin  Family History  Problem Relation Age of Onset  . CAD Mother   . CAD Father     Social History Social History   Tobacco Use  . Smoking status: Never Smoker  . Smokeless tobacco: Never Used  Substance Use Topics  . Alcohol use: Yes  . Drug use: No    Review of Systems  Constitutional: No fever/chills Eyes: No visual changes. ENT: No sore throat. Cardiovascular: Denies chest pain. Respiratory: Denies shortness of breath. Gastrointestinal: Nausea vomiting and diarrhea with no abdominal pain.   Genitourinary: Negative for dysuria. Musculoskeletal: Negative for back pain. Skin: Negative for rash. Neurological: Negative for headaches, focal weakness or numbness.   ____________________________________________   PHYSICAL EXAM:  VITAL SIGNS: ED Triage Vitals  Enc Vitals Group     BP 08/12/17 0000 (!) 72/54     Pulse Rate 08/11/17 2358 72     Resp 08/11/17 2358 (!) 24     Temp 08/11/17 2358 98.2 F (36.8 C)     Temp src --      SpO2 08/11/17 2358 95 %     Weight 08/11/17 2358 121 lb (54.9 kg)     Height 08/11/17 2358 5\' 3"  (1.6 m)     Head Circumference --      Peak Flow --      Pain Score 08/11/17 2358 4     Pain Loc --      Pain Edu? --      Excl. in [place]? --     Constitutional: Alert and oriented to self.  Actively vomiting on arrival to the emergency department Eyes: Conjunctivae are normal. PERRL. EOMI. Head: Atraumatic. Nose: No congestion/rhinnorhea. Mouth/Throat: Mucous membranes are dry .  Oropharynx non-erythematous. Cardiovascular: Normal rate, regular rhythm. Grossly normal heart sounds.  Good peripheral circulation. Respiratory: Normal respiratory effort.  No retractions. Lungs CTAB. Gastrointestinal: Soft with some diffuse tenderness to palpation. No distention.  Positive bowel sounds Musculoskeletal: No lower extremity tenderness nor edema.   Neurologic:   Normal speech and language.  Skin: Skin is pale and dry Psychiatric: Patient appears mildly confused  ____________________________________________   LABS (all labs ordered are listed, but only abnormal results are displayed)  Labs Reviewed  CBC WITH DIFFERENTIAL/PLATELET - Abnormal; Notable for the following components:      Result Value   WBC 16.9 (*)    Neutro Abs 16.2 (*)    Lymphs Abs 0.3 (*)    All other components within normal limits  COMPREHENSIVE METABOLIC PANEL - Abnormal; Notable for the following components:   Potassium 3.2 (*)    Glucose, Bld 158 (*)    BUN 38 (*)    Creatinine, Ser 1.79 (*)    GFR calc non Af Amer 24 (*)    GFR calc Af Amer 28 (*)  All other components within normal limits  TROPONIN I - Abnormal; Notable for the following components:   Troponin I 0.89 (*)    All other components within normal limits  URINALYSIS, COMPLETE (UACMP) WITH MICROSCOPIC - Abnormal; Notable for the following components:   Color, Urine YELLOW (*)    APPearance CLOUDY (*)    Hgb urine dipstick LARGE (*)    Protein, ur 100 (*)    Leukocytes, UA SMALL (*)    Bacteria, UA RARE (*)    All other components within normal limits  LACTIC ACID, PLASMA - Abnormal; Notable for the following components:   Lactic Acid, Venous 3.8 (*)    All other components within normal limits  CULTURE, BLOOD (ROUTINE X 2)  CULTURE, BLOOD (ROUTINE X 2)  URINE CULTURE  LIPASE, BLOOD  LACTIC ACID, PLASMA   ____________________________________________  EKG  ED ECG REPORT I, Loney Hering, the attending physician, personally viewed and interpreted this ECG.   Date: 08/12/2017  EKG Time: 0020  Rate: 65  Rhythm: normal sinus rhythm  Axis: normal  Intervals:none  ST&T Change: none  ____________________________________________  RADIOLOGY  ED MD interpretation: CT abdomen and pelvis: Sigmoid diverticulosis, no bowel obstruction or active inflammation, no renal  calculi.  Official radiology report(s): Ct Abdomen Pelvis Wo Contrast  Result Date: 08/12/2017 CLINICAL DATA:  84 year old female with nausea vomiting. EXAM: CT ABDOMEN AND PELVIS WITHOUT CONTRAST TECHNIQUE: Multidetector CT imaging of the abdomen and pelvis was performed following the standard protocol without IV contrast. COMPARISON:  Abdominal CT dated 04/28/2017 FINDINGS: Evaluation of this exam is limited in the absence of intravenous contrast. Lower chest: The visualized lung bases are clear. Multi vessel coronary vascular calcification as well as calcification of the mitral annulus and aortic valve. No intra-abdominal free air or free fluid. Hepatobiliary: The liver is unremarkable. Prior cholecystectomy with air in the CBD and mild pneumobilia. No retained stone noted in the central CBD. Pancreas: Unremarkable. No pancreatic ductal dilatation or surrounding inflammatory changes. Spleen: Normal in size without focal abnormality. Adrenals/Urinary Tract: The adrenal glands are unremarkable. 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 There is a right extrarenal pelvis with mild chronic pelvicaliectasis similar to prior CT. No stone. There is no hydronephrosis or nephrolithiasis on the left. The visualized ureters and urinary bladder appear unremarkable. Stomach/Bowel: There is sigmoid diverticulosis with muscular hypertrophy. No active inflammatory changes. There is no bowel obstruction or active inflammation. The appendix is not visualized with certainty. No inflammatory changes identified in the right lower quadrant. Vascular/Lymphatic: Advanced aortoiliac atherosclerotic disease with focal area of high-grade narrowing of the lumen of the aorta at the level of the renal arteries. This is similar to prior CT. Evaluation of the vasculature is limited in the absence of intravenous contrast. The IVC is unremarkable. No portal venous gas. There is no adenopathy. Reproductive: Hysterectomy.  No pelvic  mass. Other: None Musculoskeletal: There is osteopenia with degenerative changes of the spine. No acute osseous pathology. IMPRESSION: 1. Sigmoid diverticulosis. No bowel obstruction or active inflammation. 2. Right extrarenal pelvis with chronic mild pelvicaliectasis similar to prior CT. No renal calculi. 3. Advanced Aortic Atherosclerosis (ICD10-I70.0). Electronically Signed   By: Anner Crete M.D.   On: 08/12/2017 02:05    ____________________________________________   PROCEDURES  Procedure(s) performed: please, see procedure note(s).  .Critical Care Performed by: Loney Hering, MD Authorized by: Loney Hering, MD   Critical care provider statement:    Critical care time (minutes):  30  Critical care start time:  08/11/2017 11:53 PM   Critical care end time:  08/12/2017 12:27 AM   Critical care time was exclusive of:  Separately billable procedures and treating other patients   Critical care was necessary to treat or prevent imminent or life-threatening deterioration of the following conditions:  Sepsis   Critical care was time spent personally by me on the following activities:  Development of treatment plan with patient or surrogate, evaluation of patient's response to treatment, examination of patient, obtaining history from patient or surrogate, ordering and performing treatments and interventions, ordering and review of laboratory studies, ordering and review of radiographic studies, pulse oximetry, re-evaluation of patient's condition and review of old charts   I assumed direction of critical care for this patient from another provider in my specialty: no      Critical Care performed: Yes, see critical care note(s)  ____________________________________________   INITIAL IMPRESSION / ASSESSMENT AND PLAN / ED COURSE  As part of my medical decision making, I reviewed the following data within the electronic MEDICAL RECORD NUMBER Notes from prior ED visits and [place] Controlled  Substance Database   This is an 84 year old female who comes into the hospital today with some vomiting and diarrhea and feeling bad all day.  EMS states that the family thought she had a UTI but when she started vomiting they sent her in for evaluation.  The patient is vomiting green.  My differential diagnosis includes bowel obstruction, urinary tract infection, pneumonia, sepsis  The patient is afebrile on arrival.  Her initial systolic blood pressure was in the 70s but the patient had been actively vomiting.  The patient was given a liter of normal saline when she initially arrived and blood work was ordered.  The plan was altered to perform a CT scan of the patient's abdomen as she was having some abdominal pain on exam although she denied pain.   I did receive critical values of the patient's lactic acid which was 3.8 and the patient's troponin which was 0.89.  The patient's creatinine is also elevated at 1.79.  The patient's urinalysis does show too numerous to count red blood cells and white blood cells but the patient CT scan is unremarkable.  It appears that the patient may be septic.  She did receive 2 L of normal saline when she arrived as well as ceftriaxone for her infection.  After the fluid the patient's blood pressure did improve.  I will put her on some maintenance fluid at 250 mL an hour and she will be admitted to the hospitalist service.  By the time I went back in to evaluate the patient her family was in the room.  They confirmed that she had been vomiting all day and had not eaten much today.  They also confirmed that she has not complained of any other pain and she has not had any fevers.  She will be admitted to the hospitalist service.   ED Sepsis - Repeat Assessment   Performed at:    08/12/2017 226  Last Vitals:    Blood pressure (!) 119/45, pulse 62, temperature 98.2 F (36.8 C), resp. rate 18, height 5\' 3"  (1.6 m), weight 54.9 kg (121 lb), SpO2 94 %.  Heart:       RRR  no m/r/g  Lungs:     CTAB  Capillary Refill:   Less than 2 seconds  Peripheral Pulse (include location): 2+ radial   Skin (include color):   pink  ____________________________________________   FINAL CLINICAL IMPRESSION(S) / ED DIAGNOSES  Final diagnoses:  Sepsis, due to unspecified organism [facility])  Urinary tract infection with hematuria, site unspecified  Lactic acidosis  Elevated troponin  Nausea vomiting and diarrhea     ED Discharge Orders    None       Note:  This document was prepared using Dragon voice recognition software and may include unintentional dictation errors.    Loney Hering, MD 08/12/17 [phone]

## 2017-08-12 NOTE — Progress Notes (Signed)
ANTICOAGULATION CONSULT NOTE - Initial Consult  Pharmacy Consult for Heparin Indication: chest pain/ACS  Allergies  Allergen Reactions  . Penicillins     Has patient had a PCN reaction causing immediate rash, facial/tongue/throat swelling, SOB or lightheadedness with hypotension: Unknown Has patient had a PCN reaction causing severe rash involving mucus membranes or skin necrosis: Unknown Has patient had a PCN reaction that required hospitalization: No Has patient had a PCN reaction occurring within the last 10 years: No If all of the above answers are "NO", then may proceed with Cephalosporin use.   . Ciprofloxacin Rash    Patient Measurements: Height: 5\' 3"  (160 cm) Weight: 125 lb (56.7 kg) IBW/kg (Calculated) : 52.4 Heparin Dosing Weight: 56.7 kg  Vital Signs: Temp: 98.3 F (36.8 C) (04/02 1948) Temp Source: Oral (04/02 1948) BP: 140/44 (04/02 1948) Pulse Rate: 58 (04/02 1948)  Labs: Recent Labs    08/11/17 2354 08/12/17 0815 08/12/17 1038 08/12/17 1135 08/12/17 1640 08/12/17 1944  HGB 14.6 12.6  --   --   --   --   HCT 45.2 39.1  --   --   --   --   PLT 150 129*  --   --   --   --   APTT  --   --   --  33  --   --   LABPROT  --   --   --  14.4  --   --   INR  --   --   --  1.13  --   --   HEPARINUNFRC  --   --   --   --   --  0.34  CREATININE 1.79* 1.91*  --   --   --   --   TROPONINI 0.89* 1.52* 1.16*  --  0.79*  --     Estimated Creatinine Clearance: 16.5 mL/min (A) (by C-G formula based on SCr of 1.91 mg/dL (H)).   Medical History: Past Medical History:  Diagnosis Date  . Aortic stenosis   . Breast cancer ([place])   . CKD (chronic kidney disease), stage II   . Dementia   . Frequent falls   . Hypertension      Assessment: 84 yo female admitted with UTI with troponin trending up now ordered heparin drip. Pt last received SQ heparin at 0526.    Goal of Therapy:  Heparin level 0.3-0.7 units/ml Monitor platelets by anticoagulation protocol: Yes    Plan:  4/2 @1944  HL therapeutic at 0.34. Will continue current rate of heparin 700u/hr and recheck HL in 8hrs. CBC with am labs per protocol.    Pernell Dupre, PharmD, BCPS Clinical Pharmacist 08/12/2017 8:14 PM

## 2017-08-12 NOTE — Progress Notes (Signed)
ANTICOAGULATION CONSULT NOTE - Initial Consult  Pharmacy Consult for Heparin Indication: chest pain/ACS  Allergies  Allergen Reactions  . Penicillins     Has patient had a PCN reaction causing immediate rash, facial/tongue/throat swelling, SOB or lightheadedness with hypotension: Unknown Has patient had a PCN reaction causing severe rash involving mucus membranes or skin necrosis: Unknown Has patient had a PCN reaction that required hospitalization: No Has patient had a PCN reaction occurring within the last 10 years: No If all of the above answers are "NO", then may proceed with Cephalosporin use.   . Ciprofloxacin Rash    Patient Measurements: Height: 5\' 3"  (160 cm) Weight: 125 lb (56.7 kg) IBW/kg (Calculated) : 52.4 Heparin Dosing Weight: 56.7 kg  Vital Signs: Temp: 98.3 F (36.8 C) (04/02 0418) Temp Source: Oral (04/02 0418) BP: 145/43 (04/02 0937) Pulse Rate: 54 (04/02 0937)  Labs: Recent Labs    08/11/17 2354 08/12/17 0815  HGB 14.6 12.6  HCT 45.2 39.1  PLT 150 129*  CREATININE 1.79* 1.91*  TROPONINI 0.89* 1.52*    Estimated Creatinine Clearance: 16.5 mL/min (A) (by C-G formula based on SCr of 1.91 mg/dL (H)).   Medical History: Past Medical History:  Diagnosis Date  . Aortic stenosis   . Breast cancer ([place])   . CKD (chronic kidney disease), stage II   . Dementia   . Frequent falls   . Hypertension      Assessment: 84 yo female admitted with UTI with troponin trending up now ordered heparin drip. Pt last received SQ heparin at 0526.    Goal of Therapy:  Heparin level 0.3-0.7 units/ml Monitor platelets by anticoagulation protocol: Yes   Plan:  Baseline aPTT and INR ordered. (Called lab who stated they could not add on) No heparin bolus due to recently receiving SQ heparin - will likely continue to see some effect of this dose.  MD orders for heparin 800 units/hr. Will decrease heparin drip to 700 units/hr (=7 ml/hr) per protocol. Heparin level  8h after start of heparin drip. CBC in AM  Pharmacy will continue to follow.   Rayna Sexton L 08/12/2017,10:37 AM

## 2017-08-12 NOTE — Progress Notes (Addendum)
Kristin Brewer a89 y.o.femalewith a known history of dementia, remote history of breast cancer, hypertension, CKD stage II presents to hospital due to nausea, vomiting, UTI which is failed outpatient management   1 acute elevated troponin, possible NSTEMI Continue to cycle Ces, cardiology to see, Nitropaste scheduled, aspirin daily, BB rx, statin rx - ch lipids in am, morphine prn, supplemental oxygen, transfer to telemetry floor, heparin qtt, recheck echo Most recent echo 7/18 nl EF, DDI  2 acute urinary tract infection Failed outpatient management with Bactrim Continue empiric Rocephin, IV fluids for rehydration, and follow-up on cultures  3 acute kidney injury with chronic kidney disease stage III Worsing noted Nephrology to see, avoid nephrotoxic agents, BMP in the morning  4 chronic benign essential hypertension Stable Continue metoprolol  5 chronic dementia Stable Continue Aricept, increase nursing care, aspiration/fall precautions while in house  6 chronic depression Stable Continue Zoloft

## 2017-08-12 NOTE — Progress Notes (Signed)
Family Meeting Note  Advance Directive:yes  Today a meeting took place with the Patient.  Patient is unable to participate due EH:MCNOBS capacity Cognitive impairment/dementia   The following clinical team members were present during this meeting:MD  The following were discussed:Patient's diagnosis: , Patient's progosis: Unable to determine and Goals for treatment: Full Code  Additional follow-up to be provided: prn  Time spent during discussion:20 minutes  Gorden Harms, MD

## 2017-08-12 NOTE — ED Notes (Signed)
Pt to CT scan. Family at bedside.

## 2017-08-13 ENCOUNTER — Inpatient Hospital Stay
Admit: 2017-08-13 | Discharge: 2017-08-13 | Disposition: A | Discharge status: Home / Self Care | Payer: Medicare HMO | Attending: Family Medicine | Admitting: Family Medicine

## 2017-08-13 ENCOUNTER — Inpatient Hospital Stay: Payer: Medicare HMO

## 2017-08-13 LAB — ECHOCARDIOGRAM COMPLETE
Height: 63 in
Weight: 2000.01 oz

## 2017-08-13 LAB — CBC
HCT: 36.3 % (ref 35.0–47.0)
HEMOGLOBIN: 11.7 g/dL — AB (ref 12.0–16.0)
MCH: 28.9 pg (ref 26.0–34.0)
MCHC: 32.2 g/dL (ref 32.0–36.0)
MCV: 89.9 fL (ref 80.0–100.0)
PLATELETS: 111 10*3/uL — AB (ref 150–440)
RBC: 4.03 MIL/uL (ref 3.80–5.20)
RDW: 14.4 % (ref 11.5–14.5)
WBC: 17.3 10*3/uL — AB (ref 3.6–11.0)

## 2017-08-13 LAB — LIPID PANEL
CHOL/HDL RATIO: 3.1 ratio
CHOLESTEROL: 184 mg/dL (ref 0–200)
HDL: 60 mg/dL (ref >40)
LDL Cholesterol: 100 mg/dL — ABNORMAL HIGH (ref 0–99)
TRIGLYCERIDES: 120 mg/dL (ref <150)
VLDL: 24 mg/dL (ref 0–40)

## 2017-08-13 LAB — HEPARIN LEVEL (UNFRACTIONATED)
Heparin Unfractionated: 0.26 IU/mL — ABNORMAL LOW (ref 0.30–0.70)
Heparin Unfractionated: 0.18 IU/mL — ABNORMAL LOW (ref 0.30–0.70)

## 2017-08-13 LAB — BASIC METABOLIC PANEL
ANION GAP: 6 (ref 5–15)
BUN: 40 mg/dL — ABNORMAL HIGH (ref 6–20)
CALCIUM: 7.7 mg/dL — AB (ref 8.9–10.3)
CO2: 20 mmol/L — ABNORMAL LOW (ref 22–32)
CREATININE: 1.62 mg/dL — AB (ref 0.44–1.00)
Chloride: 111 mmol/L (ref 101–111)
GFR, EST AFRICAN AMERICAN: 31 mL/min — AB (ref >60)
GFR, EST NON AFRICAN AMERICAN: 27 mL/min — AB (ref >60)
Glucose, Bld: 157 mg/dL — ABNORMAL HIGH (ref 65–99)
Potassium: 4.4 mmol/L (ref 3.5–5.1)
SODIUM: 137 mmol/L (ref 135–145)

## 2017-08-13 LAB — MAGNESIUM: Magnesium: 2.2 mg/dL (ref 1.7–2.4)

## 2017-08-13 LAB — URINE CULTURE: Culture: NO GROWTH

## 2017-08-13 LAB — PROCALCITONIN: Procalcitonin: 60.81 ng/mL

## 2017-08-13 MED ORDER — SODIUM CHLORIDE 0.9% FLUSH
3.0000 mL | Freq: Two times a day (BID) | INTRAVENOUS | Status: DC
  Administered 2017-08-14 (×2): 3 mL via INTRAVENOUS

## 2017-08-13 MED ORDER — HEPARIN BOLUS VIA INFUSION
900.0000 [IU] | Freq: Once | INTRAVENOUS | Status: AC
  Administered 2017-08-13: 900 [IU] via INTRAVENOUS
  Filled 2017-08-13: qty 900

## 2017-08-13 MED ORDER — CALCIUM CARBONATE ANTACID 500 MG PO CHEW
400.0000 mg | CHEWABLE_TABLET | Freq: Three times a day (TID) | ORAL | Status: AC
  Administered 2017-08-13 – 2017-08-14 (×3): 400 mg via ORAL
  Filled 2017-08-13 (×3): qty 2

## 2017-08-13 MED ORDER — HYDROCODONE-ACETAMINOPHEN 5-325 MG PO TABS
1.0000 | ORAL_TABLET | Freq: Two times a day (BID) | ORAL | Status: DC
  Administered 2017-08-13 – 2017-08-14 (×2): 1 via ORAL
  Filled 2017-08-13 (×3): qty 1

## 2017-08-13 MED ORDER — HEPARIN BOLUS VIA INFUSION
900.0000 [IU] | Freq: Once | INTRAVENOUS | Status: DC
  Filled 2017-08-13: qty 900

## 2017-08-13 NOTE — Progress Notes (Signed)
*  PRELIMINARY RESULTS* Echocardiogram 2D Echocardiogram has been performed.  Kristin Brewer 08/13/2017, 11:46 AM

## 2017-08-13 NOTE — Progress Notes (Signed)
ANTICOAGULATION CONSULT NOTE - Initial Consult  Pharmacy Consult for Heparin Indication: chest pain/ACS  Allergies  Allergen Reactions  . Penicillins     Has patient had a PCN reaction causing immediate rash, facial/tongue/throat swelling, SOB or lightheadedness with hypotension: Unknown Has patient had a PCN reaction causing severe rash involving mucus membranes or skin necrosis: Unknown Has patient had a PCN reaction that required hospitalization: No Has patient had a PCN reaction occurring within the last 10 years: No If all of the above answers are "NO", then may proceed with Cephalosporin use.   . Ciprofloxacin Rash    Patient Measurements: Height: 5\' 3"  (160 cm) Weight: 125 lb (56.7 kg) IBW/kg (Calculated) : 52.4 Heparin Dosing Weight: 56.7 kg  Vital Signs: Temp: 97.9 F (36.6 C) (04/03 0332) Temp Source: Oral (04/03 0332) BP: 139/56 (04/03 0332) Pulse Rate: 57 (04/03 0332)  Labs: Recent Labs    08/11/17 2354 08/12/17 0815 08/12/17 1038 08/12/17 1135 08/12/17 1640 08/12/17 1944 08/13/17 0242  HGB 14.6 12.6  --   --   --   --  11.7*  HCT 45.2 39.1  --   --   --   --  36.3  PLT 150 129*  --   --   --   --  111*  APTT  --   --   --  33  --   --   --   LABPROT  --   --   --  14.4  --   --   --   INR  --   --   --  1.13  --   --   --   HEPARINUNFRC  --   --   --   --   --  0.34 0.26*  CREATININE 1.79* 1.91*  --   --   --   --  1.62*  TROPONINI 0.89* 1.52* 1.16*  --  0.79*  --   --     Estimated Creatinine Clearance: 19.5 mL/min (A) (by C-G formula based on SCr of 1.62 mg/dL (H)).   Medical History: Past Medical History:  Diagnosis Date  . Aortic stenosis   . Breast cancer ([place])   . CKD (chronic kidney disease), stage II   . Dementia   . Frequent falls   . Hypertension      Assessment: 84 yo female admitted with UTI with troponin trending up now ordered heparin drip. Pt last received SQ heparin at 0526.    Goal of Therapy:  Heparin level 0.3-0.7  units/ml Monitor platelets by anticoagulation protocol: Yes   Plan:  4/2 @1944  HL therapeutic at 0.34. Will continue current rate of heparin 700u/hr and recheck HL in 8hrs. CBC with am labs per protocol.    4/3 AM heparin level 0.26. 900 unit bolus and increase rate to 800 units/hr. Recheck in 8 hours.  Eloise Harman, PharmD, BCPS Clinical Pharmacist 08/13/2017 5:21 AM

## 2017-08-13 NOTE — Progress Notes (Signed)
ANTICOAGULATION CONSULT NOTE - Initial Consult  Pharmacy Consult for Heparin Indication: chest pain/ACS  Allergies  Allergen Reactions  . Penicillins     Has patient had a PCN reaction causing immediate rash, facial/tongue/throat swelling, SOB or lightheadedness with hypotension: Unknown Has patient had a PCN reaction causing severe rash involving mucus membranes or skin necrosis: Unknown Has patient had a PCN reaction that required hospitalization: No Has patient had a PCN reaction occurring within the last 10 years: No If all of the above answers are "NO", then may proceed with Cephalosporin use.   . Ciprofloxacin Rash    Patient Measurements: Height: 5\' 3"  (160 cm) Weight: 125 lb (56.7 kg) IBW/kg (Calculated) : 52.4 Heparin Dosing Weight: 56.7 kg  Vital Signs: Temp: 98.1 F (36.7 C) (04/03 0724) Temp Source: Oral (04/03 0724) BP: 138/60 (04/03 0724) Pulse Rate: 59 (04/03 0724)  Labs: Recent Labs    08/11/17 2354 08/12/17 0815 08/12/17 1038 08/12/17 1135 08/12/17 1640 08/12/17 1944 08/13/17 0242 08/13/17 1347  HGB 14.6 12.6  --   --   --   --  11.7*  --   HCT 45.2 39.1  --   --   --   --  36.3  --   PLT 150 129*  --   --   --   --  111*  --   APTT  --   --   --  33  --   --   --   --   LABPROT  --   --   --  14.4  --   --   --   --   INR  --   --   --  1.13  --   --   --   --   HEPARINUNFRC  --   --   --   --   --  0.34 0.26* 0.18*  CREATININE 1.79* 1.91*  --   --   --   --  1.62*  --   TROPONINI 0.89* 1.52* 1.16*  --  0.79*  --   --   --     Estimated Creatinine Clearance: 19.5 mL/min (A) (by C-G formula based on SCr of 1.62 mg/dL (H)).   Medical History: Past Medical History:  Diagnosis Date  . Aortic stenosis   . Breast cancer ([place])   . CKD (chronic kidney disease), stage II   . Dementia   . Frequent falls   . Hypertension      Assessment: 84 yo female admitted with UTI with troponin trending up now ordered heparin drip. Pt last received SQ  heparin at 0526.    Goal of Therapy:  Heparin level 0.3-0.7 units/ml Monitor platelets by anticoagulation protocol: Yes   Plan:  4/2 @1944  HL therapeutic at 0.34. Will continue current rate of heparin 700u/hr and recheck HL in 8hrs. CBC with am labs per protocol.    4/3 AM heparin level 0.26. 900 unit bolus and increase rate to 800 units/hr. Recheck in 8 hours.  4/3@ Noon, HL 0.18. 900 unit bolus and increase rate to 1000 units/hr. Recheck in 8 hours.  Thomasenia Sales, PharmD, BCGP Clinical Pharmacist 08/13/2017 2:18 PM

## 2017-08-13 NOTE — Progress Notes (Signed)
Patient off unit for testing. Will resume care upon return. Kristin Brewer  

## 2017-08-13 NOTE — Evaluation (Signed)
Physical Therapy Evaluation Patient Details Name: Kristin Brewer MRN: [id number] DOB: 1926/10/31 Today's Date: 08/13/2017   History of Present Illness  84 yo female with onset of UTI with low K+ dementia, demand ischemia, elevated troponin, and acute kidney injury was admitted.  Dehydrated, poor lab values, poor historian. PMHx:  aortic stenosis, diverticulosis, HTN, falls, dementia, breast CA  Clinical Impression  Pt was admitted to hosp for management of her symptoms, specifically for demand ischemia and UTI.  Pt gets loss of HR with mobility and did demonstrate tolerance for mobility using HHA and will try the walker and SPC next visit to see how she mobilizes with them.  Follow acutely for progression with SPC or walker.     Follow Up Recommendations Home health PT;Supervision for mobility/OOB    Equipment Recommendations  Rolling walker with 5" wheels    Recommendations for Other Services       Precautions / Restrictions Precautions Precautions: Fall(telemetry) Precaution Comments: pt was up walking alone in room when PT arrived Restrictions Weight Bearing Restrictions: No      Mobility  Bed Mobility               General bed mobility comments: up when PT arrived  Transfers Overall transfer level: Needs assistance Equipment used: 1 person hand held assist Transfers: Sit to/from Stand Sit to Stand: Min assist            Ambulation/Gait Ambulation/Gait assistance: Min assist Ambulation Distance (Feet): 35 Feet Assistive device: 1 person hand held assist(holding furniture) Gait Pattern/deviations: Step-through pattern;Decreased stride length;Wide base of support;Trunk flexed Gait velocity: reduced Gait velocity interpretation: Below normal speed for age/gender General Gait Details: pt was up with no assist, to walk to BR and noted her instability and need to hold furniture  Stairs            Wheelchair Mobility    Modified Rankin (Stroke Patients  Only)       Balance Overall balance assessment: History of Falls;Needs assistance Sitting-balance support: Feet supported Sitting balance-Leahy Scale: Fair     Standing balance support: Single extremity supported;During functional activity Standing balance-Leahy Scale: Fair Standing balance comment: less than fair dynamically                             Pertinent Vitals/Pain Pain Assessment: No/denies pain    Home Living Family/patient expects to be discharged to:: Private residence Living Arrangements: Children Available Help at Discharge: Family;Available 24 hours/day Type of Home: House Home Access: Level entry     Home Layout: Multi-level;Bed/bath upstairs Home Equipment: Cane - single point;Walker - 2 wheels      Prior Function Level of Independence: Independent with assistive device(s)         Comments: Mod indep with SPC vs RW for ADLs, household and limited community mobility; endorses multiple fall history.  Family available to assist with functional activities, household responsibilities as needed.     Hand Dominance   Dominant Hand: Right    Extremity/Trunk Assessment   Upper Extremity Assessment Upper Extremity Assessment: Overall WFL for tasks assessed    Lower Extremity Assessment Lower Extremity Assessment: Generalized weakness    Cervical / Trunk Assessment Cervical / Trunk Assessment: Kyphotic  Communication   Communication: No difficulties  Cognition Arousal/Alertness: Awake/alert Behavior During Therapy: WFL for tasks assessed/performed Overall Cognitive Status: Within Functional Limits for tasks assessed  General Comments      Exercises     Assessment/Plan    PT Assessment Patient needs continued PT services  PT Problem List Decreased strength;Decreased activity tolerance;Decreased range of motion;Decreased balance;Decreased mobility;Decreased  coordination;Decreased cognition;Decreased knowledge of use of DME;Decreased safety awareness;Decreased knowledge of precautions;Cardiopulmonary status limiting activity       PT Treatment Interventions DME instruction;Gait training;Functional mobility training;Therapeutic activities;Therapeutic exercise;Balance training;Neuromuscular re-education;Stair training;Patient/family education    PT Goals (Current goals can be found in the Care Plan section)  Acute Rehab PT Goals Patient Stated Goal: to go home with her sister PT Goal Formulation: With patient Time For Goal Achievement: 08/27/17 Potential to Achieve Goals: Good    Frequency Min 2X/week   Barriers to discharge Inaccessible home environment;Decreased caregiver support has stairs to enter house    Co-evaluation               AM-PAC PT "6 Clicks" Daily Activity  Outcome Measure Difficulty turning over in bed (including adjusting bedclothes, sheets and blankets)?: Unable Difficulty moving from lying on back to sitting on the side of the bed? : Unable Difficulty sitting down on and standing up from a chair with arms (e.g., wheelchair, bedside commode, etc,.)?: Unable Help needed moving to and from a bed to chair (including a wheelchair)?: A Little Help needed walking in hospital room?: A Little Help needed climbing 3-5 steps with a railing? : A Lot 6 Click Score: 11    End of Session Equipment Utilized During Treatment: Gait belt Activity Tolerance: Patient tolerated treatment well;Patient limited by fatigue Patient left: in chair;with call bell/phone within reach;with chair alarm set Nurse Communication: Mobility status PT Visit Diagnosis: Unsteadiness on feet (R26.81);Repeated falls (R29.6);Muscle weakness (generalized) (M62.81);Difficulty in walking, not elsewhere classified (R26.2);Adult, failure to thrive (R62.7)    Time: 8032-1224 PT Time Calculation (min) (ACUTE ONLY): 12 min   Charges:   PT Evaluation $PT  Eval Moderate Complexity: 1 Mod     PT G Codes:   PT G-Codes **NOT FOR INPATIENT CLASS** Functional Assessment Tool Used: AM-PAC 6 Clicks Basic Mobility    Ramond Dial 08/13/2017, 8:23 PM   Mee Hives, PT MS Acute Rehab Dept. Number: [place]

## 2017-08-13 NOTE — Consult Note (Signed)
[facility]  CONSULT NOTE    Date: 08/13/2017                  Patient Name:  Kristin Brewer  MRN: [id number]  DOB: 1926-09-02  Age / Sex: 84 y.o., female         PCP: Idelle Crouch, MD                 Service Requesting Consult: Dr. Jerelyn Charles                 Reason for Consult: Acute Renal Failure            History of Present Illness: Kristin Brewer is a 84 y.o.  female admitted to acute urinary tract infection who failed outpatient antibiotic therapy with TMP/SMX.  Creatinine on admission of 1.79 and started on IV fluids. Creatinine improved to 1.62 today.    Medications: Outpatient medications: Medications Prior to Admission  Medication Sig Dispense Refill Last Dose  . aspirin 81 MG EC tablet Take 81 mg by mouth daily.  2 08/11/2017 at Unknown time  . B Complex-C (B-COMPLEX WITH VITAMIN C) tablet Take 1 tablet by mouth daily.   08/11/2017 at Unknown time  . docusate sodium (COLACE) 100 MG capsule Take 100 mg by mouth daily as needed for mild constipation.   prn at prn  . donepezil (ARICEPT) 10 MG tablet Take 1 tablet (10 mg total) by mouth at bedtime. 30 tablet 1 08/11/2017 at Unknown time  . HYDROcodone-acetaminophen (NORCO/VICODIN) 5-325 MG tablet Take 1 tablet by mouth every 6 (six) hours as needed. for pain  0 prn at prn  . indapamide (LOZOL) 2.5 MG tablet Take 2.5 mg by mouth daily.   08/11/2017 at Unknown time  . LORazepam (ATIVAN) 1 MG tablet Take 1 tablet (1 mg total) by mouth at bedtime as needed. 5 tablet 0 prn at prn  . magnesium oxide (MAG-OX) 400 (241.3 Mg) MG tablet Take 1 tablet by mouth daily.  5 08/11/2017 at Unknown time  . metoprolol succinate (TOPROL-XL) 100 MG 24 hr tablet TAKE 1 TABLET (100 MG TOTAL) BY MOUTH ONCE DAILY.  11 08/11/2017 at Unknown time  . ranitidine (ZANTAC) 150 MG tablet TAKE 1 TABLET (150 MG TOTAL) BY MOUTH 2 (TWO) TIMES DAILY.  11 08/11/2017 at Unknown time  . Vitamin D, Ergocalciferol, (DRISDOL) 50000 units CAPS capsule TAKE 1  CAPSULE BY MOUTH EVERY MONTH  3 08/11/2017 at Unknown time  . CRANBERRY PO Take 2 tablets by mouth daily.   Not Taking at Unknown time  . feeding supplement, ENSURE ENLIVE, (ENSURE ENLIVE) LIQD Take 237 mLs by mouth 2 (two) times daily between meals. 237 mL 12   . sertraline (ZOLOFT) 50 MG tablet Take 1 tablet by mouth at bedtime.   11 Not Taking at Unknown time  . tolterodine (DETROL LA) 4 MG 24 hr capsule Take 4 mg by mouth daily.  11 Not Taking at Unknown time    Current medications: Current Facility-Administered Medications  Medication Dose Route Frequency Provider Last Rate Last Dose  . acetaminophen (TYLENOL) tablet 650 mg  650 mg Oral Q6H PRN Salary, Montell D, MD       Or  . acetaminophen (TYLENOL) suppository 650 mg  650 mg Rectal Q6H PRN Salary, Montell D, MD      . aspirin EC tablet 325 mg  325 mg Oral Daily Salary, Montell D, MD   325 mg at 08/13/17 1051  .  atorvastatin (LIPITOR) tablet 20 mg  20 mg Oral q1800 Salary, Montell D, MD   20 mg at 08/12/17 1726  . bisacodyl (DULCOLAX) suppository 10 mg  10 mg Rectal Daily PRN Salary, Montell D, MD      . calcium carbonate (TUMS - dosed in mg elemental calcium) chewable tablet 400 mg of elemental calcium  400 mg of elemental calcium Oral TID Salary, Montell D, MD      . cefTRIAXone (ROCEPHIN) 1 g in sodium chloride 0.9 % 100 mL IVPB  1 g Intravenous Q24H Salary, Avel Peace, MD   Stopped at 08/13/17 1125  . docusate sodium (COLACE) capsule 100 mg  100 mg Oral BID Salary, Montell D, MD   100 mg at 08/13/17 1051  . donepezil (ARICEPT) tablet 10 mg  10 mg Oral QHS Salary, Montell D, MD   10 mg at 08/12/17 2253  . famotidine (PEPCID) tablet 20 mg  20 mg Oral Daily Salary, Montell D, MD   20 mg at 08/13/17 1051  . feeding supplement (ENSURE ENLIVE) (ENSURE ENLIVE) liquid 237 mL  237 mL Oral BID BM Salary, Montell D, MD   237 mL at 08/13/17 1052  . fesoterodine (TOVIAZ) tablet 4 mg  4 mg Oral Daily Salary, Montell D, MD   4 mg at 08/12/17 0932  .  heparin bolus via infusion 900 Units  900 Units Intravenous Once Coffee, Donna Christen, RPH      . hydrALAZINE (APRESOLINE) injection 10 mg  10 mg Intravenous Q4H PRN Salary, Montell D, MD      . HYDROcodone-acetaminophen (NORCO/VICODIN) 5-325 MG per tablet 1 tablet  1 tablet Oral Q6H PRN Gorden Harms, MD   1 tablet at 08/12/17 2032  . indapamide (LOZOL) tablet 2.5 mg  2.5 mg Oral Daily Salary, Montell D, MD   2.5 mg at 08/13/17 1051  . LORazepam (ATIVAN) tablet 0.5 mg  0.5 mg Oral QHS Salary, Montell D, MD   0.5 mg at 08/12/17 2249  . magnesium oxide (MAG-OX) tablet 400 mg  400 mg Oral Daily Salary, Montell D, MD   400 mg at 08/13/17 1051  . metoprolol succinate (TOPROL-XL) 24 hr tablet 100 mg  100 mg Oral Daily Salary, Montell D, MD   100 mg at 08/13/17 1051  . morphine 2 MG/ML injection 2 mg  2 mg Intravenous Q2H PRN Salary, Montell D, MD   2 mg at 08/12/17 2253  . nitroGLYCERIN (NITROGLYN) 2 % ointment 0.5 inch  0.5 inch Topical Q8H Salary, Montell D, MD   0.5 inch at 08/13/17 1330  . ondansetron (ZOFRAN) tablet 4 mg  4 mg Oral Q6H PRN Salary, Montell D, MD       Or  . ondansetron (ZOFRAN) injection 4 mg  4 mg Intravenous Q6H PRN Salary, Montell D, MD      . polyethylene glycol (MIRALAX / GLYCOLAX) packet 17 g  17 g Oral Daily PRN Salary, Montell D, MD      . sodium phosphate (FLEET) 7-19 GM/118ML enema 1 enema  1 enema Rectal Once PRN Salary, Avel Peace, MD          Allergies: Allergies  Allergen Reactions  . Penicillins     Has patient had a PCN reaction causing immediate rash, facial/tongue/throat swelling, SOB or lightheadedness with hypotension: Unknown Has patient had a PCN reaction causing severe rash involving mucus membranes or skin necrosis: Unknown Has patient had a PCN reaction that required hospitalization: No Has patient had a PCN reaction  occurring within the last 10 years: No If all of the above answers are "NO", then may proceed with Cephalosporin use.   . Ciprofloxacin  Rash      Past Medical History: Past Medical History:  Diagnosis Date  . Aortic stenosis   . Breast cancer ([place])   . CKD (chronic kidney disease), stage II   . Dementia   . Frequent falls   . Hypertension      Past Surgical History: Past Surgical History:  Procedure Laterality Date  . BREAST LUMPECTOMY    . CHOLECYSTECTOMY    . KNEE SURGERY Bilateral   . THYROIDECTOMY       Family History: Family History  Problem Relation Age of Onset  . CAD Mother   . CAD Father      Social History: Social History   Socioeconomic History  . Marital status: Widowed    Spouse name: Not on file  . Number of children: Not on file  . Years of education: Not on file  . Highest education level: Not on file  Occupational History  . Not on file  Social Needs  . Financial resource strain: Not on file  . Food insecurity:    Worry: Not on file    Inability: Not on file  . Transportation needs:    Medical: Not on file    Non-medical: Not on file  Tobacco Use  . Smoking status: Never Smoker  . Smokeless tobacco: Never Used  Substance and Sexual Activity  . Alcohol use: Yes  . Drug use: No  . Sexual activity: Not on file  Lifestyle  . Physical activity:    Days per week: Not on file    Minutes per session: Not on file  . Stress: Not on file  Relationships  . Social connections:    Talks on phone: Not on file    Gets together: Not on file    Attends religious service: Not on file    Active member of club or organization: Not on file    Attends meetings of clubs or organizations: Not on file    Relationship status: Not on file  . Intimate partner violence:    Fear of current or ex partner: Not on file    Emotionally abused: Not on file    Physically abused: Not on file    Forced sexual activity: Not on file  Other Topics Concern  . Not on file  Social History Narrative   Lives at home with family. Ambulates with a cane. Otherwise independent     Review of  Systems: Review of Systems  Unable to perform ROS: Dementia    Vital Signs: Blood pressure 138/60, pulse (!) 59, temperature 98.1 F (36.7 C), temperature source Oral, resp. rate 18, height 5\' 3"  (1.6 m), weight 56.7 kg (125 lb), SpO2 98 %.  Weight trends: Filed Weights   08/11/17 2358 08/12/17 0400  Weight: 54.9 kg (121 lb) 56.7 kg (125 lb)    Physical Exam: General: NAD,   Head: Normocephalic, atraumatic. Moist oral mucosal membranes  Eyes: Anicteric, PERRL  Neck: Supple, trachea midline  Lungs:  Clear to auscultation  Heart: Regular rate and rhythm  Abdomen:  Soft, nontender,   Extremities:  no  peripheral edema.  Neurologic: Nonfocal, moving all four extremities, alert and oriented x 4  Skin: No lesions         Lab results: Basic Metabolic Panel: Recent Labs  Lab 08/11/17 2354 08/12/17 0815 08/13/17 0242  NA 141  --  137  K 3.2*  --  4.4  CL 104  --  111  CO2 23  --  20*  GLUCOSE 158*  --  157*  BUN 38*  --  40*  CREATININE 1.79* 1.91* 1.62*  CALCIUM 9.4  --  7.7*  MG  --   --  2.2    Liver Function Tests: Recent Labs  Lab 08/11/17 2354  AST 41  ALT 24  ALKPHOS 76  BILITOT 1.1  PROT 7.0  ALBUMIN 3.9   Recent Labs  Lab 08/11/17 2354  LIPASE 50   No results for input(s): AMMONIA in the last 168 hours.  CBC: Recent Labs  Lab 08/11/17 2354 08/12/17 0815 08/13/17 0242  WBC 16.9* 19.6* 17.3*  NEUTROABS 16.2*  --   --   HGB 14.6 12.6 11.7*  HCT 45.2 39.1 36.3  MCV 88.9 88.9 89.9  PLT 150 129* 111*    Cardiac Enzymes: Recent Labs  Lab 08/11/17 2354 08/12/17 0815 08/12/17 1038 08/12/17 1640  TROPONINI 0.89* 1.52* 1.16* 0.79*    BNP: Invalid input(s): POCBNP  CBG: No results for input(s): GLUCAP in the last 168 hours.  Microbiology: Results for orders placed or performed during the hospital encounter of 08/11/17  Urine Culture     Status: None   Collection Time: 08/12/17  1:26 AM  Result Value Ref Range Status   Specimen  Description   Final    URINE, RANDOM Performed at [facility], [address] [place], [place] [address]    Special Requests   Final    NONE Performed at [facility], [address], [place], [place] [address]    Culture   Final    NO GROWTH Performed at [facility], [place] [address], [place], [place] [address]    Report Status 08/13/2017 FINAL  Final  Blood culture (routine x 2)     Status: None (Preliminary result)   Collection Time: 08/12/17  8:15 AM  Result Value Ref Range Status   Specimen Description BLOOD BLOOD RIGHT FOREARM  Final   Special Requests   Final    BOTTLES DRAWN AEROBIC AND ANAEROBIC Blood Culture adequate volume   Culture   Final    NO GROWTH < 24 HOURS Performed at [facility], [address] [place], [place] [address]    Report Status PENDING  Incomplete  Blood culture (routine x 2)     Status: None (Preliminary result)   Collection Time: 08/12/17  8:23 AM  Result Value Ref Range Status   Specimen Description BLOOD BLOOD RIGHT HAND  Final   Special Requests   Final    BOTTLES DRAWN AEROBIC AND ANAEROBIC Blood Culture adequate volume   Culture   Final    NO GROWTH < 24 HOURS Performed at [facility], [address], [place], [place] [address]    Report Status PENDING  Incomplete    Coagulation Studies: Recent Labs    08/12/17 1135  LABPROT 14.4  INR 1.13    Urinalysis: Recent Labs    08/11/17 2355  COLORURINE YELLOW*  LABSPEC 1.013  PHURINE 6.0  GLUCOSEU NEGATIVE  HGBUR LARGE*  BILIRUBINUR NEGATIVE  KETONESUR NEGATIVE  PROTEINUR 100*  NITRITE NEGATIVE  LEUKOCYTESUR SMALL*      Imaging: Ct Abdomen Pelvis Wo Contrast  Result Date: 08/12/2017 CLINICAL DATA:  84 year old female with nausea vomiting. EXAM: CT ABDOMEN AND PELVIS WITHOUT CONTRAST TECHNIQUE: Multidetector CT imaging of the  abdomen and pelvis was performed following the standard protocol without IV contrast. COMPARISON:   Abdominal CT dated 04/28/2017 FINDINGS: Evaluation of this exam is limited in the absence of intravenous contrast. Lower chest: The visualized lung bases are clear. Multi vessel coronary vascular calcification as well as calcification of the mitral annulus and aortic valve. No intra-abdominal free air or free fluid. Hepatobiliary: The liver is unremarkable. Prior cholecystectomy with air in the CBD and mild pneumobilia. No retained stone noted in the central CBD. Pancreas: Unremarkable. No pancreatic ductal dilatation or surrounding inflammatory changes. Spleen: Normal in size without focal abnormality. Adrenals/Urinary Tract: The adrenal glands are unremarkable. 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 There is a right extrarenal pelvis with mild chronic pelvicaliectasis similar to prior CT. No stone. There is no hydronephrosis or nephrolithiasis on the left. The visualized ureters and urinary bladder appear unremarkable. Stomach/Bowel: There is sigmoid diverticulosis with muscular hypertrophy. No active inflammatory changes. There is no bowel obstruction or active inflammation. The appendix is not visualized with certainty. No inflammatory changes identified in the right lower quadrant. Vascular/Lymphatic: Advanced aortoiliac atherosclerotic disease with focal area of high-grade narrowing of the lumen of the aorta at the level of the renal arteries. This is similar to prior CT. Evaluation of the vasculature is limited in the absence of intravenous contrast. The IVC is unremarkable. No portal venous gas. There is no adenopathy. Reproductive: Hysterectomy.  No pelvic mass. Other: None Musculoskeletal: There is osteopenia with degenerative changes of the spine. No acute osseous pathology. IMPRESSION: 1. Sigmoid diverticulosis. No bowel obstruction or active inflammation. 2. Right extrarenal pelvis with chronic mild pelvicaliectasis similar to prior CT. No renal calculi. 3. Advanced Aortic Atherosclerosis  (ICD10-I70.0). Electronically Signed   By: Anner Crete M.D.   On: 08/12/2017 02:05   US Renal  Result Date: 08/13/2017 CLINICAL DATA:  Chronic renal disease EXAM: RENAL / URINARY TRACT ULTRASOUND COMPLETE COMPARISON:  Abdominal and pelvic noncontrast CT scan of August 12, 2017 FINDINGS: Right Kidney: Length: 12.9 cm. There is an extrarenal pelvis versus mild hydronephrosis on the right. The renal cortical echotexture is increased and is approximately equal to that of the adjacent liver. Left Kidney: Length: 10.2 cm. There is no hydronephrosis. The renal cortical echotexture is similar to that on the right. Bladder: The partially distended urinary bladder is unremarkable. IMPRESSION: Mildly increased renal cortical echotexture bilaterally consistent with medical renal disease. Extrarenal pelvis versus mild hydronephrosis on the right which has been previously described. Electronically Signed   By: David  Martinique M.D.   On: 08/13/2017 10:12      Assessment & Plan: Kristin Brewer is a 84 y.o. white female with hypertension, dementia, aortic stenosis, history of breast cancer, who was admitted to [facility] on 08/11/2017 for Lactic acidosis [E87.2] Elevated troponin [R74.8] Nausea vomiting and diarrhea [R11.2, R19.7] Sepsis, due to unspecified organism ([place]) [A41.9] Urinary tract infection with hematuria, site unspecified [N39.0, R31.9]  1. Acute Renal Failure: 2. Chronic Kidney Disease stage III: baseline creatinine of 1.17, GFR of 40 3. Urinary Tract Infection 4. Coronary artery disease: elevated cardiac enzymes.  5. Anemia  Plan Hold indapamide - Discontinue IV fluids - Encourage PO intake - Ultrasound reviewed with patient - [facility] ceftriaxone       LOS: 1 Lucia Mccreadie 4/3/20193:14 PM

## 2017-08-13 NOTE — Care Management (Signed)
Admitted from home for uti that failed outpatient treatment.  Patient was transferred to 2A from [facility] within last 24 hours.  Elevated troponins. nephrology is following for renal failure

## 2017-08-13 NOTE — Plan of Care (Signed)

## 2017-08-13 NOTE — Progress Notes (Signed)
[facility] NAME: Paraskevi Wolk    MR#:  [id number]  DATE OF BIRTH:  1926-07-24  SUBJECTIVE:  CHIEF COMPLAINT:   Chief Complaint  Patient presents with  . Emesis  Patient complains of lower abdominal pain only, no events overnight, cardiology input appreciated, renal ultrasound noted, nephrology to see  REVIEW OF SYSTEMS:  CONSTITUTIONAL: No fever, fatigue or weakness.  EYES: No blurred or double vision.  EARS, NOSE, AND THROAT: No tinnitus or ear pain.  RESPIRATORY: No cough, shortness of breath, wheezing or hemoptysis.  CARDIOVASCULAR: No chest pain, orthopnea, edema.  GASTROINTESTINAL: No nausea, vomiting, diarrhea or abdominal pain.  GENITOURINARY: No dysuria, hematuria.  ENDOCRINE: No polyuria, nocturia,  HEMATOLOGY: No anemia, easy bruising or bleeding SKIN: No rash or lesion. MUSCULOSKELETAL: No joint pain or arthritis.   NEUROLOGIC: No tingling, numbness, weakness.  PSYCHIATRY: No anxiety or depression.   ROS  DRUG ALLERGIES:   Allergies  Allergen Reactions  . Penicillins     Has patient had a PCN reaction causing immediate rash, facial/tongue/throat swelling, SOB or lightheadedness with hypotension: Unknown Has patient had a PCN reaction causing severe rash involving mucus membranes or skin necrosis: Unknown Has patient had a PCN reaction that required hospitalization: No Has patient had a PCN reaction occurring within the last 10 years: No If all of the above answers are "NO", then may proceed with Cephalosporin use.   . Ciprofloxacin Rash    VITALS:  Blood pressure 138/60, pulse (!) 59, temperature 98.1 F (36.7 C), temperature source Oral, resp. rate 18, height 5\' 3"  (1.6 m), weight 56.7 kg (125 lb), SpO2 98 %.  PHYSICAL EXAMINATION:  GENERAL:  84 y.o.-year-old patient lying in the bed with no acute distress.  EYES: Pupils equal, round, reactive to light and accommodation. No scleral icterus. Extraocular  muscles intact.  HEENT: Head atraumatic, normocephalic. Oropharynx and nasopharynx clear.  NECK:  Supple, no jugular venous distention. No thyroid enlargement, no tenderness.  LUNGS: Normal breath sounds bilaterally, no wheezing, rales,rhonchi or crepitation. No use of accessory muscles of respiration.  CARDIOVASCULAR: S1, S2 normal. No murmurs, rubs, or gallops.  ABDOMEN: Soft, nontender, nondistended. Bowel sounds present. No organomegaly or mass.  EXTREMITIES: No pedal edema, cyanosis, or clubbing.  NEUROLOGIC: Cranial nerves II through XII are intact. Muscle strength 5/5 in all extremities. Sensation intact. Gait not checked.  PSYCHIATRIC: The patient is alert and oriented x 3.  SKIN: No obvious rash, lesion, or ulcer.   Physical Exam LABORATORY PANEL:   CBC Recent Labs  Lab 08/13/17 0242  WBC 17.3*  HGB 11.7*  HCT 36.3  PLT 111*   ------------------------------------------------------------------------------------------------------------------  Chemistries  Recent Labs  Lab 08/11/17 2354  08/13/17 0242  NA 141  --  137  K 3.2*  --  4.4  CL 104  --  111  CO2 23  --  20*  GLUCOSE 158*  --  157*  BUN 38*  --  40*  CREATININE 1.79*   < > 1.62*  CALCIUM 9.4  --  7.7*  MG  --   --  2.2  AST 41  --   --   ALT 24  --   --   ALKPHOS 76  --   --   BILITOT 1.1  --   --    < > = values in this interval not displayed.   ------------------------------------------------------------------------------------------------------------------  Cardiac Enzymes Recent Labs  Lab 08/12/17 1038 08/12/17 1640  TROPONINI  1.16* 0.79*   ------------------------------------------------------------------------------------------------------------------  RADIOLOGY:  Ct Abdomen Pelvis Wo Contrast  Result Date: 08/12/2017 CLINICAL DATA:  84 year old female with nausea vomiting. EXAM: CT ABDOMEN AND PELVIS WITHOUT CONTRAST TECHNIQUE: Multidetector CT imaging of the abdomen and pelvis was  performed following the standard protocol without IV contrast. COMPARISON:  Abdominal CT dated 04/28/2017 FINDINGS: Evaluation of this exam is limited in the absence of intravenous contrast. Lower chest: The visualized lung bases are clear. Multi vessel coronary vascular calcification as well as calcification of the mitral annulus and aortic valve. No intra-abdominal free air or free fluid. Hepatobiliary: The liver is unremarkable. Prior cholecystectomy with air in the CBD and mild pneumobilia. No retained stone noted in the central CBD. Pancreas: Unremarkable. No pancreatic ductal dilatation or surrounding inflammatory changes. Spleen: Normal in size without focal abnormality. Adrenals/Urinary Tract: The adrenal glands are unremarkable. 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 There is a right extrarenal pelvis with mild chronic pelvicaliectasis similar to prior CT. No stone. There is no hydronephrosis or nephrolithiasis on the left. The visualized ureters and urinary bladder appear unremarkable. Stomach/Bowel: There is sigmoid diverticulosis with muscular hypertrophy. No active inflammatory changes. There is no bowel obstruction or active inflammation. The appendix is not visualized with certainty. No inflammatory changes identified in the right lower quadrant. Vascular/Lymphatic: Advanced aortoiliac atherosclerotic disease with focal area of high-grade narrowing of the lumen of the aorta at the level of the renal arteries. This is similar to prior CT. Evaluation of the vasculature is limited in the absence of intravenous contrast. The IVC is unremarkable. No portal venous gas. There is no adenopathy. Reproductive: Hysterectomy.  No pelvic mass. Other: None Musculoskeletal: There is osteopenia with degenerative changes of the spine. No acute osseous pathology. IMPRESSION: 1. Sigmoid diverticulosis. No bowel obstruction or active inflammation. 2. Right extrarenal pelvis with chronic mild pelvicaliectasis  similar to prior CT. No renal calculi. 3. Advanced Aortic Atherosclerosis (ICD10-I70.0). Electronically Signed   By: Anner Crete M.D.   On: 08/12/2017 02:05   US Renal  Result Date: 08/13/2017 CLINICAL DATA:  Chronic renal disease EXAM: RENAL / URINARY TRACT ULTRASOUND COMPLETE COMPARISON:  Abdominal and pelvic noncontrast CT scan of August 12, 2017 FINDINGS: Right Kidney: Length: 12.9 cm. There is an extrarenal pelvis versus mild hydronephrosis on the right. The renal cortical echotexture is increased and is approximately equal to that of the adjacent liver. Left Kidney: Length: 10.2 cm. There is no hydronephrosis. The renal cortical echotexture is similar to that on the right. Bladder: The partially distended urinary bladder is unremarkable. IMPRESSION: Mildly increased renal cortical echotexture bilaterally consistent with medical renal disease. Extrarenal pelvis versus mild hydronephrosis on the right which has been previously described. Electronically Signed   By: David  Martinique M.D.   On: 08/13/2017 10:12    ASSESSMENT AND PLAN:  [name] a89 y.o.femalewith a known history ofdementia, remote history of breast cancer, hypertension, CKD stage II presents to hospitaldue to nausea, vomiting, UTI which is failed outpatient management  1acute lower abdominal pain/flank pain Exact etiology unknown, ?  Musculoskeletal etiology Patient was admitted to the hospital for presumed failed outpatient acute UTI, yet urine culture is negative Renal ultrasound noted for possible right-sided hydronephrosis-noted on previous study, unchanged CT abdomen on admission was unimpressive Case discussed with urology-no intervention indicated, suspect it may be related to possible mycoplasma which is hard to grow out on culture-continue 3-day course of IV Rocephin  2 acute elevated troponins NSTEMI unlikely per cardiology ?  Secondary to  renal insufficiency and/or demand ischemia Cardiology input  appreciated, continue aspirin, beta-blocker therapy, statin therapy, discontinue heparin drip, follow-up on echocardiogram Most recent echo 7/18 nl EF, DDI  3 acute urinary tract infection Failed outpatient management with Bactrim Continue empiric Rocephin for 3-day course, IV fluids for rehydration, urine culture negative-please see discussion above  4 acute kidney injury with chronic kidney disease stage II Improved from yesterday Nephrology to see,  continue to avoid nephrotoxic agents, BMP in the morning, strict I&O monitoring  5chronic benign essential hypertension Stable Continue metoprolol  6chronic dementia Stable Continue Aricept, increase nursing care, aspiration/fall precautions while in house, physical therapy to evaluate/treat  7chronic depression Stable Continue Zoloft  Disposition to skilled nursing facility versus home in the care of family in 1-2 days   All the records are reviewed and case discussed with Care Management/Social Workerr. Management plans discussed with the patient, family and they are in agreement.  CODE STATUS: full  TOTAL TIME TAKING CARE OF THIS PATIENT: 35 minutes.     POSSIBLE D/C IN 1-2 DAYS, DEPENDING ON CLINICAL CONDITION.   Avel Peace Kelseigh Diver M.D on 08/13/2017   Between 7am to 6pm - Pager - [phone]  After 6pm go to [email or web address] - password [email or web address] [facility]  Office  [phone]  CC: Primary care physician; Idelle Crouch, MD  Note: This dictation was prepared with Dragon dictation along with smaller phrase technology. Any transcriptional errors that result from this process are unintentional.

## 2017-08-14 LAB — BASIC METABOLIC PANEL
Anion gap: 7 (ref 5–15)
BUN: 27 mg/dL — AB (ref 6–20)
CHLORIDE: 112 mmol/L — AB (ref 101–111)
CO2: 22 mmol/L (ref 22–32)
Calcium: 8.9 mg/dL (ref 8.9–10.3)
Creatinine, Ser: 1.36 mg/dL — ABNORMAL HIGH (ref 0.44–1.00)
GFR calc Af Amer: 39 mL/min — ABNORMAL LOW (ref >60)
GFR calc non Af Amer: 33 mL/min — ABNORMAL LOW (ref >60)
GLUCOSE: 103 mg/dL — AB (ref 65–99)
POTASSIUM: 4.1 mmol/L (ref 3.5–5.1)
Sodium: 141 mmol/L (ref 135–145)

## 2017-08-14 MED ORDER — ATORVASTATIN CALCIUM 20 MG PO TABS: 20.0000 mg | ORAL_TABLET | Freq: Every day | ORAL | 0 refills | Status: AC

## 2017-08-14 MED ORDER — ACETAMINOPHEN 325 MG PO TABS: 650.0000 mg | ORAL_TABLET | Freq: Four times a day (QID) | ORAL | 0 refills | Status: AC | PRN

## 2017-08-14 MED ORDER — CEFUROXIME AXETIL 250 MG PO TABS: 250.0000 mg | ORAL_TABLET | Freq: Two times a day (BID) | ORAL | 0 refills | Status: AC

## 2017-08-14 NOTE — Plan of Care (Signed)

## 2017-08-14 NOTE — Clinical Social Work Note (Signed)
CSW spoke to patient's daughter in regards to ALFs in [place].  Patient's daughter was asking for a FL2, because the plan is for patient to eventually move to [place] and go to an ALF.  CSW informed patient's daughter that an FL2 is specific for the state, and a [place] FL2 will not be able to be used in [place].  CSW also told her that some states do not require an FL2, CSW suggested that once patient moves to [place] and is established with a PCP, then the PCP can complete a [place] FL2 if it is required.  CSW to sign off please reconsult if other social work needs arise.  Jones Broom. [place], MSW, [place]  08/14/2017 3:08 PM

## 2017-08-14 NOTE — Progress Notes (Signed)
Patient is discharge home in a stable condition, summary and f/u care given to both patient and daughter , verbalized understanding .

## 2017-08-14 NOTE — Progress Notes (Signed)
[facility]  ROUNDING NOTE   Subjective:   Creatinine 1.36 (1.62)  Objective:  Vital signs in last 24 hours:  Temp:  [97.3 F (36.3 C)-98.2 F (36.8 C)] 97.4 F (36.3 C) (04/04 0757) Pulse Rate:  [54-69] 61 (04/04 0757) Resp:  [17-20] 18 (04/04 0757) BP: (137-199)/(73-75) 153/75 (04/04 0757) SpO2:  [98 %-100 %] 99 % (04/04 0757) Weight:  [58 kg (127 lb 14.4 oz)] 58 kg (127 lb 14.4 oz) (04/04 0514)  Weight change:  Filed Weights   08/11/17 2358 08/12/17 0400 08/14/17 0514  Weight: 54.9 kg (121 lb) 56.7 kg (125 lb) 58 kg (127 lb 14.4 oz)    Intake/Output: I/O last 3 completed shifts: In: 64 [P.O.:960] Out: 1200 [Urine:1200]   Intake/Output this shift:  Total I/O In: -  Out: 300 [Urine:300]  Physical Exam: General: NAD,   Head: Normocephalic, atraumatic. Moist oral mucosal membranes  Eyes: Anicteric, PERRL  Neck: Supple, trachea midline  Lungs:  Clear to auscultation  Heart: Regular rate and rhythm  Abdomen:  Soft, nontender,   Extremities: No peripheral edema.  Neurologic: Nonfocal, moving all four extremities  Skin: No lesions        Basic Metabolic Panel: Recent Labs  Lab 08/11/17 2354 08/12/17 0815 08/13/17 0242 08/14/17 0440  NA 141  --  137 141  K 3.2*  --  4.4 4.1  CL 104  --  111 112*  CO2 23  --  20* 22  GLUCOSE 158*  --  157* 103*  BUN 38*  --  40* 27*  CREATININE 1.79* 1.91* 1.62* 1.36*  CALCIUM 9.4  --  7.7* 8.9  MG  --   --  2.2  --     Liver Function Tests: Recent Labs  Lab 08/11/17 2354  AST 41  ALT 24  ALKPHOS 76  BILITOT 1.1  PROT 7.0  ALBUMIN 3.9   Recent Labs  Lab 08/11/17 2354  LIPASE 50   No results for input(s): AMMONIA in the last 168 hours.  CBC: Recent Labs  Lab 08/11/17 2354 08/12/17 0815 08/13/17 0242  WBC 16.9* 19.6* 17.3*  NEUTROABS 16.2*  --   --   HGB 14.6 12.6 11.7*  HCT 45.2 39.1 36.3  MCV 88.9 88.9 89.9  PLT 150 129* 111*    Cardiac Enzymes: Recent Labs  Lab  08/11/17 2354 08/12/17 0815 08/12/17 1038 08/12/17 1640  TROPONINI 0.89* 1.52* 1.16* 0.79*    BNP: Invalid input(s): POCBNP  CBG: No results for input(s): GLUCAP in the last 168 hours.  Microbiology: Results for orders placed or performed during the hospital encounter of 08/11/17  Urine Culture     Status: None   Collection Time: 08/12/17  1:26 AM  Result Value Ref Range Status   Specimen Description   Final    URINE, RANDOM Performed at [facility], [address], [place], [place] [address]    Special Requests   Final    NONE Performed at [facility], [address] [place], [place] [address]    Culture   Final    NO GROWTH Performed at [facility], [place] [address] [place], [place] [address]    Report Status 08/13/2017 FINAL  Final  Blood culture (routine x 2)     Status: None (Preliminary result)   Collection Time: 08/12/17  8:15 AM  Result Value Ref Range Status   Specimen Description BLOOD BLOOD RIGHT FOREARM  Final   Special Requests   Final  BOTTLES DRAWN AEROBIC AND ANAEROBIC Blood Culture adequate volume   Culture   Final    NO GROWTH 2 DAYS Performed at [facility], [address] [place], [place] [address]    Report Status PENDING  Incomplete  Blood culture (routine x 2)     Status: None (Preliminary result)   Collection Time: 08/12/17  8:23 AM  Result Value Ref Range Status   Specimen Description BLOOD BLOOD RIGHT HAND  Final   Special Requests   Final    BOTTLES DRAWN AEROBIC AND ANAEROBIC Blood Culture adequate volume   Culture   Final    NO GROWTH 2 DAYS Performed at [facility], [address], [place], [place] [address]    Report Status PENDING  Incomplete    Coagulation Studies: Recent Labs    08/12/17 1135  LABPROT 14.4  INR 1.13    Urinalysis: Recent Labs    08/11/17 2355  COLORURINE YELLOW*  LABSPEC 1.013  PHURINE 6.0  GLUCOSEU NEGATIVE  HGBUR LARGE*  BILIRUBINUR  NEGATIVE  KETONESUR NEGATIVE  PROTEINUR 100*  NITRITE NEGATIVE  LEUKOCYTESUR SMALL*      Imaging: US Renal  Result Date: 08/13/2017 CLINICAL DATA:  Chronic renal disease EXAM: RENAL / URINARY TRACT ULTRASOUND COMPLETE COMPARISON:  Abdominal and pelvic noncontrast CT scan of August 12, 2017 FINDINGS: Right Kidney: Length: 12.9 cm. There is an extrarenal pelvis versus mild hydronephrosis on the right. The renal cortical echotexture is increased and is approximately equal to that of the adjacent liver. Left Kidney: Length: 10.2 cm. There is no hydronephrosis. The renal cortical echotexture is similar to that on the right. Bladder: The partially distended urinary bladder is unremarkable. IMPRESSION: Mildly increased renal cortical echotexture bilaterally consistent with medical renal disease. Extrarenal pelvis versus mild hydronephrosis on the right which has been previously described. Electronically Signed   By: David  Martinique M.D.   On: 08/13/2017 10:12     Medications:   . cefTRIAXone (ROCEPHIN)  IV Stopped (08/14/17 0858)   . aspirin EC  325 mg Oral Daily  . atorvastatin  20 mg Oral q1800  . docusate sodium  100 mg Oral BID  . donepezil  10 mg Oral QHS  . famotidine  20 mg Oral Daily  . feeding supplement (ENSURE ENLIVE)  237 mL Oral BID BM  . fesoterodine  4 mg Oral Daily  . HYDROcodone-acetaminophen  1 tablet Oral BID  . indapamide  2.5 mg Oral Daily  . LORazepam  0.5 mg Oral QHS  . magnesium oxide  400 mg Oral Daily  . metoprolol succinate  100 mg Oral Daily  . nitroGLYCERIN  0.5 inch Topical Q8H  . sodium chloride flush  3 mL Intravenous Q12H   acetaminophen **OR** acetaminophen, bisacodyl, hydrALAZINE, HYDROcodone-acetaminophen, morphine injection, ondansetron **OR** ondansetron (ZOFRAN) IV, polyethylene glycol, sodium phosphate  Assessment/ Plan:  [name] is a 84 y.o. white female with hypertension, dementia, aortic stenosis, history of breast cancer, who was admitted  to [facility] on 08/11/2017   1. Acute Renal Failure 2. Chronic Kidney Disease stage III: baseline creatinine of 1.17, GFR of 40 3. Urinary Tract Infection 4. Coronary artery disease: elevated cardiac enzymes.  5. Anemia 6. Hypertension  Plan Restarted on indapamide - Encourage PO intake - Empiric ceftriaxone   LOS: 2 Gearline Spilman 4/4/201912:37 PM

## 2017-08-14 NOTE — Care Management (Signed)
Patient for discharge home today and in need of home health.  Daughter will eventually have patient moved back to an assisted living facility in [place].  Agency preference is the agency that saw her last time and that was [facility].  [facility] accepted referral for RN PT SW.

## 2017-08-14 NOTE — Discharge Summary (Signed)
[name] NAME: Kristin Brewer    MR#:  [id number]  DATE OF BIRTH:  [birth year]  DATE OF ADMISSION:  08/11/2017 ADMITTING PHYSICIAN: Gorden Harms, MD  DATE OF DISCHARGE: 08/14/2017   PRIMARY CARE PHYSICIAN: Idelle Crouch, MD    ADMISSION DIAGNOSIS:  Lactic acidosis [E87.2] Elevated troponin [R74.8] Nausea vomiting and diarrhea [R11.2, R19.7] Sepsis, due to unspecified organism ([place]) [A41.9] Urinary tract infection with hematuria, site unspecified [N39.0, R31.9]  DISCHARGE DIAGNOSIS:  Active Problems:   UTI (urinary tract infection)   SECONDARY DIAGNOSIS:   Past Medical History:  Diagnosis Date  . Aortic stenosis   . Breast cancer ([place])   . CKD (chronic kidney disease), stage II   . Dementia   . Frequent falls   . Hypertension     HOSPITAL COURSE:   [name] a84 y.o.femalewith a known history ofdementia, remote history of breast cancer, hypertension, CKD stage II presents to hospitaldue to nausea, vomiting, UTI which is failed outpatient management  1acute lower abdominal pain/flank pain Exact etiology unknown, ?  Musculoskeletal etiology Patient was admitted to the hospital for presumed failed outpatient acute UTI, yet urine culture is negative. Renal ultrasound noted for possible right-sided hydronephrosis-noted on previous study, unchanged CT abdomen on admission was unimpressive Case discussed with urology-no intervention indicated, suspect it may be related to possible mycoplasma which is hard to grow out on culture-continue 3-day course of IV Rocephin  will give oral ceftin on discharge.  2 acute elevated troponins- stress induced. NSTEMI unlikely per cardiology ?  Secondary to renal insufficiency and/or demand ischemia Cardiology input appreciated, continue aspirin, beta-blocker therapy, statin therapy, discontinue heparin drip, follow-up on echocardiogram Most recent echo 7/18 nl  EF, DDI  3acute urinary tract infection Failed outpatient management with Bactrim Continueempiric Rocephin for 3-day course, IV fluids for rehydration, urine culture negative-please see discussion above  4acute kidney injury with chronic kidney disease stage II Improved from yesterday Nephrology to see, continue to avoid nephrotoxic agents, BMP in the morning, strict I&O monitoring  5chronic benign essential hypertension Stable Continue metoprolol  6chronic dementia Stable Continue Aricept, increase nursing care, aspiration/fall precautions while in house, physical therapy to evaluate/treat  7chronic depression Stable Continue Zoloft  Pt feels much better, PT suggest HHA PT, spoke to her daughter and case manager about discharge. DISCHARGE CONDITIONS:   Stable.  CONSULTS OBTAINED:  Treatment Team:  Yolonda Kida, MD Lavonia Dana, MD  DRUG ALLERGIES:   Allergies  Allergen Reactions  . Penicillins     Has patient had a PCN reaction causing immediate rash, facial/tongue/throat swelling, SOB or lightheadedness with hypotension: Unknown Has patient had a PCN reaction causing severe rash involving mucus membranes or skin necrosis: Unknown Has patient had a PCN reaction that required hospitalization: No Has patient had a PCN reaction occurring within the last 10 years: No If all of the above answers are "NO", then may proceed with Cephalosporin use.   . Ciprofloxacin Rash    DISCHARGE MEDICATIONS:   Allergies as of 08/14/2017      Reactions   Penicillins    Has patient had a PCN reaction causing immediate rash, facial/tongue/throat swelling, SOB or lightheadedness with hypotension: Unknown Has patient had a PCN reaction causing severe rash involving mucus membranes or skin necrosis: Unknown Has patient had a PCN reaction that required hospitalization: No Has patient had a PCN reaction occurring within the last 10 years: No If all of the above  answers  are "NO", then may proceed with Cephalosporin use.   Ciprofloxacin Rash      Medication List    TAKE these medications   acetaminophen 325 MG tablet Commonly known as:  TYLENOL Take 2 tablets (650 mg total) by mouth every 6 (six) hours as needed for mild pain (or Fever >/= 101).   aspirin 81 MG EC tablet Take 81 mg by mouth daily.   atorvastatin 20 MG tablet Commonly known as:  LIPITOR Take 1 tablet (20 mg total) by mouth daily at 6 PM.   B-complex with vitamin C tablet Take 1 tablet by mouth daily.   cefUROXime 250 MG tablet Commonly known as:  CEFTIN Take 1 tablet (250 mg total) by mouth 2 (two) times daily for 3 days.   CRANBERRY PO Take 2 tablets by mouth daily.   docusate sodium 100 MG capsule Commonly known as:  COLACE Take 100 mg by mouth daily as needed for mild constipation.   donepezil 10 MG tablet Commonly known as:  ARICEPT Take 1 tablet (10 mg total) by mouth at bedtime.   feeding supplement (ENSURE ENLIVE) Liqd Take 237 mLs by mouth 2 (two) times daily between meals.   HYDROcodone-acetaminophen 5-325 MG tablet Commonly known as:  NORCO/VICODIN Take 1 tablet by mouth every 6 (six) hours as needed. for pain   indapamide 2.5 MG tablet Commonly known as:  LOZOL Take 2.5 mg by mouth daily.   LORazepam 1 MG tablet Commonly known as:  ATIVAN Take 1 tablet (1 mg total) by mouth at bedtime as needed.   magnesium oxide 400 (241.3 Mg) MG tablet Commonly known as:  MAG-OX Take 1 tablet by mouth daily.   metoprolol succinate 100 MG 24 hr tablet Commonly known as:  TOPROL-XL TAKE 1 TABLET (100 MG TOTAL) BY MOUTH ONCE DAILY.   ranitidine 150 MG tablet Commonly known as:  ZANTAC TAKE 1 TABLET (150 MG TOTAL) BY MOUTH 2 (TWO) TIMES DAILY.   sertraline 50 MG tablet Commonly known as:  ZOLOFT Take 1 tablet by mouth at bedtime.   tolterodine 4 MG 24 hr capsule Commonly known as:  DETROL LA Take 4 mg by mouth daily.   Vitamin D (Ergocalciferol) 50000  units Caps capsule Commonly known as:  DRISDOL TAKE 1 CAPSULE BY MOUTH EVERY MONTH        DISCHARGE INSTRUCTIONS:    Follow with PMD in 1-2 weeks.  If you experience worsening of your admission symptoms, develop shortness of breath, life threatening emergency, suicidal or homicidal thoughts you must seek medical attention immediately by calling 911 or calling your MD immediately  if symptoms less severe.  You Must read complete instructions/literature along with all the possible adverse reactions/side effects for all the Medicines you take and that have been prescribed to you. Take any new Medicines after you have completely understood and accept all the possible adverse reactions/side effects.   Please note  You were cared for by a hospitalist during your hospital stay. If you have any questions about your discharge medications or the care you received while you were in the hospital after you are discharged, you can call the unit and asked to speak with the hospitalist on call if the hospitalist that took care of you is not available. Once you are discharged, your primary care physician will handle any further medical issues. Please note that NO REFILLS for any discharge medications will be authorized once you are discharged, as it is imperative that you return  to your primary care physician (or establish a relationship with a primary care physician if you do not have one) for your aftercare needs so that they can reassess your need for medications and monitor your lab values.    Today   CHIEF COMPLAINT:   Chief Complaint  Patient presents with  . Emesis    HISTORY OF PRESENT ILLNESS:  Kristin Brewer  is a 84 y.o. female currently under treatment for UTI by primary care provider with Bactrim, presenting with nausea/vomiting/lower abdominal pain to the emergency room, workup noted for abnormal urinalysis concerning for UTI, elevated troponin 0.89, potassium 3.2, creatinine 1.7, lactic  acid 3.8, white count 16.9, CT abdomen unimpressive, patient evaluated in the emergency room, patient is demented and is a poor historian, patient is now been noted for acute UTI currently on outpatient treatment with Bactrim, acute kidney injury with chronic kidney disease stage II, and acute dehydration.   VITAL SIGNS:  Blood pressure (!) 171/54, pulse 64, temperature 97.6 F (36.4 C), temperature source Oral, resp. rate 16, height 5\' 3"  (1.6 m), weight 58 kg (127 lb 14.4 oz), SpO2 98 %.  I/O:    Intake/Output Summary (Last 24 hours) at 08/14/2017 1424 Last data filed at 08/14/2017 1406 Gross per 24 hour  Intake 720 ml  Output 600 ml  Net 120 ml    PHYSICAL EXAMINATION:   GENERAL:  84 y.o.-year-old patient lying in the bed with no acute distress.  EYES: Pupils equal, round, reactive to light and accommodation. No scleral icterus. Extraocular muscles intact.  HEENT: Head atraumatic, normocephalic. Oropharynx and nasopharynx clear.  NECK:  Supple, no jugular venous distention. No thyroid enlargement, no tenderness.  LUNGS: Normal breath sounds bilaterally, no wheezing, rales,rhonchi or crepitation. No use of accessory muscles of respiration.  CARDIOVASCULAR: S1, S2 normal. No murmurs, rubs, or gallops.  ABDOMEN: Soft, nontender, nondistended. Bowel sounds present. No organomegaly or mass.  EXTREMITIES: No pedal edema, cyanosis, or clubbing.  NEUROLOGIC: Cranial nerves II through XII are intact. Muscle strength 5/5 in all extremities. Sensation intact. Gait not checked.  PSYCHIATRIC: The patient is alert and oriented x 3.  SKIN: No obvious rash, lesion, or ulcer.     DATA REVIEW:   CBC Recent Labs  Lab 08/13/17 0242  WBC 17.3*  HGB 11.7*  HCT 36.3  PLT 111*    Chemistries  Recent Labs  Lab 08/11/17 2354  08/13/17 0242 08/14/17 0440  NA 141  --  137 141  K 3.2*  --  4.4 4.1  CL 104  --  111 112*  CO2 23  --  20* 22  GLUCOSE 158*  --  157* 103*  BUN 38*  --  40* 27*   CREATININE 1.79*   < > 1.62* 1.36*  CALCIUM 9.4  --  7.7* 8.9  MG  --   --  2.2  --   AST 41  --   --   --   ALT 24  --   --   --   ALKPHOS 76  --   --   --   BILITOT 1.1  --   --   --    < > = values in this interval not displayed.    Cardiac Enzymes Recent Labs  Lab 08/12/17 1640  TROPONINI 0.79*    Microbiology Results  Results for orders placed or performed during the hospital encounter of 08/11/17  Urine Culture     Status: None   Collection Time: 08/12/17  1:26 AM  Result Value Ref Range Status   Specimen Description   Final    URINE, RANDOM Performed at [facility], [address] [place], [place] [address]    Special Requests   Final    NONE Performed at [facility], [address] [place], [place] [address]    Culture   Final    NO GROWTH Performed at [facility], [address], [place], [place] [address]    Report Status 08/13/2017 FINAL  Final  Blood culture (routine x 2)     Status: None (Preliminary result)   Collection Time: 08/12/17  8:15 AM  Result Value Ref Range Status   Specimen Description BLOOD BLOOD RIGHT FOREARM  Final   Special Requests   Final    BOTTLES DRAWN AEROBIC AND ANAEROBIC Blood Culture adequate volume   Culture   Final    NO GROWTH 2 DAYS Performed at [facility], [address] [place] [address] [place], [place] [address]    Report Status PENDING  Incomplete  Blood culture (routine x 2)     Status: None (Preliminary result)   Collection Time: 08/12/17  8:23 AM  Result Value Ref Range Status   Specimen Description BLOOD BLOOD RIGHT HAND  Final   Special Requests   Final    BOTTLES DRAWN AEROBIC AND ANAEROBIC Blood Culture adequate volume   Culture   Final    NO GROWTH 2 DAYS Performed at [facility], [address], [place], [place] [address]    Report Status PENDING  Incomplete    RADIOLOGY:  US Renal  Result Date: 08/13/2017 CLINICAL DATA:  Chronic renal disease EXAM: RENAL /  URINARY TRACT ULTRASOUND COMPLETE COMPARISON:  Abdominal and pelvic noncontrast CT scan of August 12, 2017 FINDINGS: Right Kidney: Length: 12.9 cm. There is an extrarenal pelvis versus mild hydronephrosis on the right. The renal cortical echotexture is increased and is approximately equal to that of the adjacent liver. Left Kidney: Length: 10.2 cm. There is no hydronephrosis. The renal cortical echotexture is similar to that on the right. Bladder: The partially distended urinary bladder is unremarkable. IMPRESSION: Mildly increased renal cortical echotexture bilaterally consistent with medical renal disease. Extrarenal pelvis versus mild hydronephrosis on the right which has been previously described. Electronically Signed   By: David  Martinique M.D.   On: 08/13/2017 10:12    EKG:   Orders placed or performed during the hospital encounter of 08/11/17  . ED EKG  . ED EKG      Management plans discussed with the patient, family and they are in agreement.  CODE STATUS:     Code Status Orders  (From admission, onward)        Start     Ordered   08/12/17 0421  Full code  Continuous     08/12/17 0420    Code Status History    Date Active Date Inactive Code Status Order ID Comments User Context   04/28/2017 1319 05/02/2017 1857 DNR [id number]  Gladstone Lighter, MD Inpatient   11/27/2016 0018 12/03/2016 1950 DNR [id number]  Vaughan Basta, MD Inpatient      TOTAL TIME TAKING CARE OF THIS PATIENT: 35 minutes.    Vaughan Basta M.D on 08/14/2017 at 2:24 PM  Between 7am to 6pm - Pager - [phone]  After 6pm go to [email or web address] - password [email or web address] [facility]  Office  [phone]  CC: Primary care physician; Idelle Crouch, MD   Note:  This dictation was prepared with Dragon dictation along with smaller phrase technology. Any transcriptional errors that result from this process are unintentional.

## 2017-08-17 LAB — CULTURE, BLOOD (ROUTINE X 2)
CULTURE: NO GROWTH
CULTURE: NO GROWTH
SPECIAL REQUESTS: ADEQUATE
Special Requests: ADEQUATE

## 2018-06-16 ENCOUNTER — Other Ambulatory Visit (INDEPENDENT_AMBULATORY_CARE_PROVIDER_SITE_OTHER): Payer: Self-pay | Admitting: Vascular Surgery

## 2018-06-16 DIAGNOSIS — I6523 Occlusion and stenosis of bilateral carotid arteries: Secondary | ICD-10-CM

## 2018-10-09 IMAGING — CT CT ABD-PELV W/O CM
2 of 4 series · 16 of 46 positions shown, 18 images · non-contrast
Comparison: Abdominal CT dated 04/28/2017

CLINICAL DATA: 89-year-old female with nausea vomiting.

EXAM:
CT ABDOMEN AND PELVIS WITHOUT CONTRAST
TECHNIQUE: Multidetector CT imaging of the abdomen and pelvis was performed
following the standard protocol without IV contrast.

[Series 2: routine abd/pel wo · axial · 0.74mm/px · z∈[-489,-104]mm · 13 of 85 slices shown, 15 images]
[im 4/85  soft-tissue]
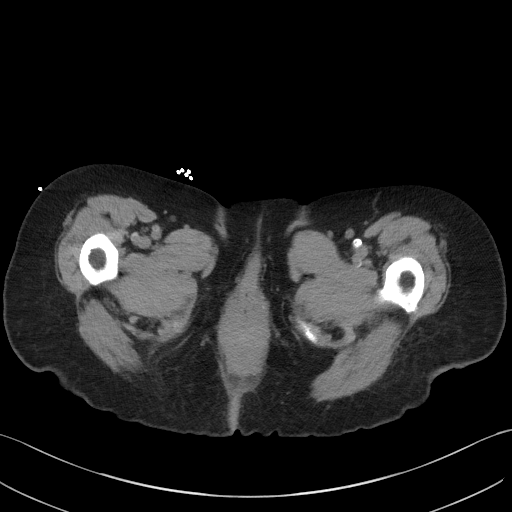
[im 4/85  bone]
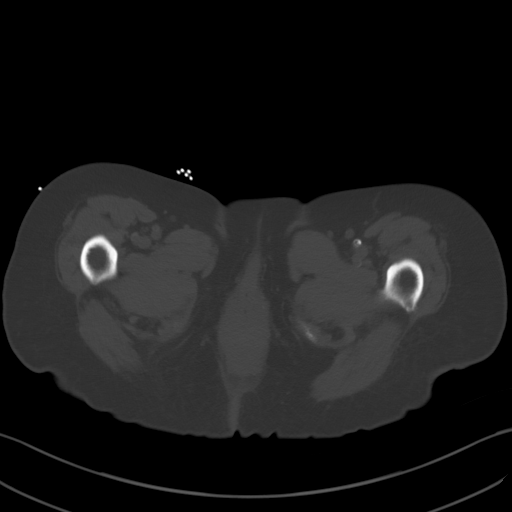
[im 11/85  soft-tissue]
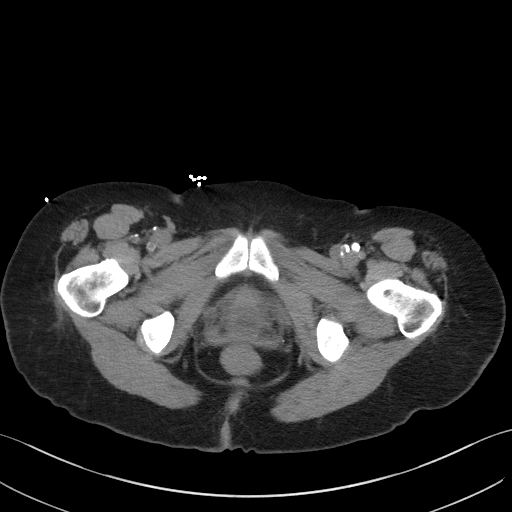
[im 18/85  soft-tissue]
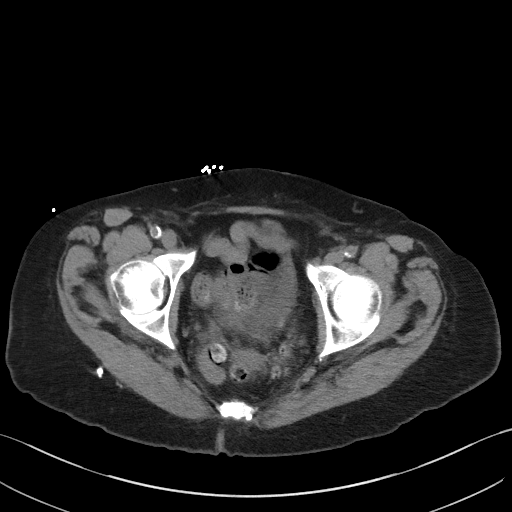
[im 25/85  soft-tissue]
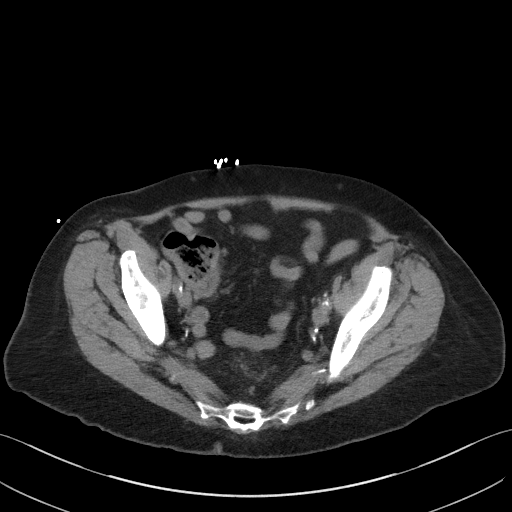
[im 29/85  soft-tissue]
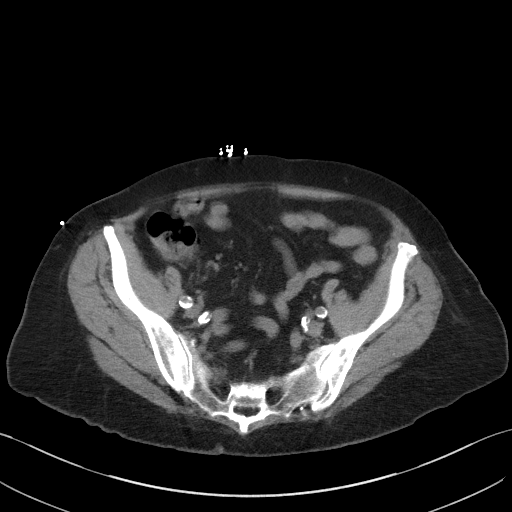
[im 36/85  soft-tissue]
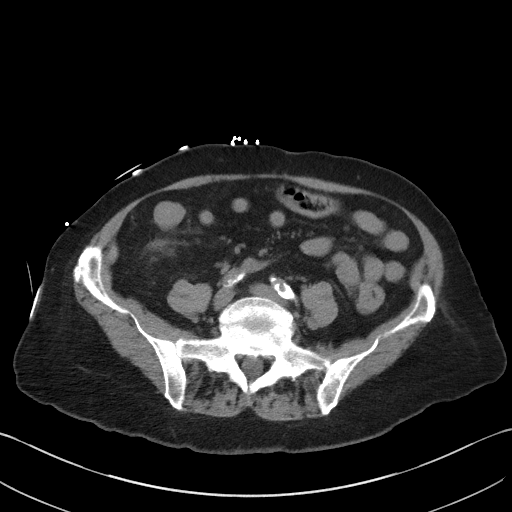
[im 43/85  soft-tissue]
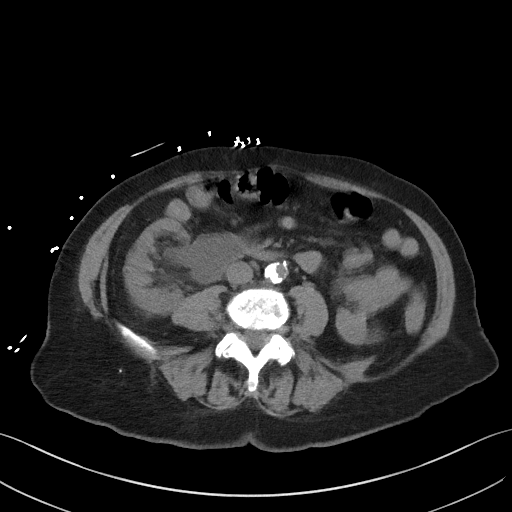
[im 50/85  soft-tissue]
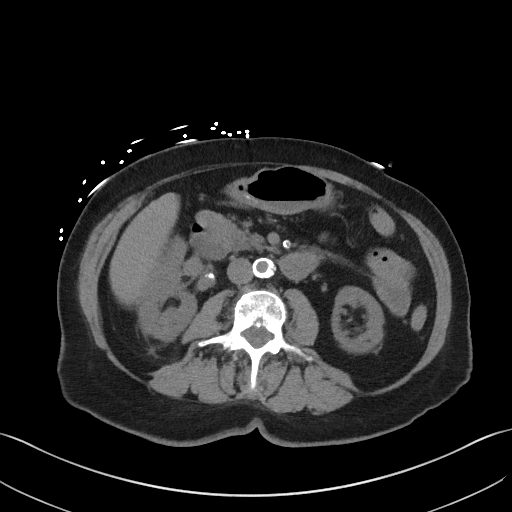
[im 57/85  soft-tissue]
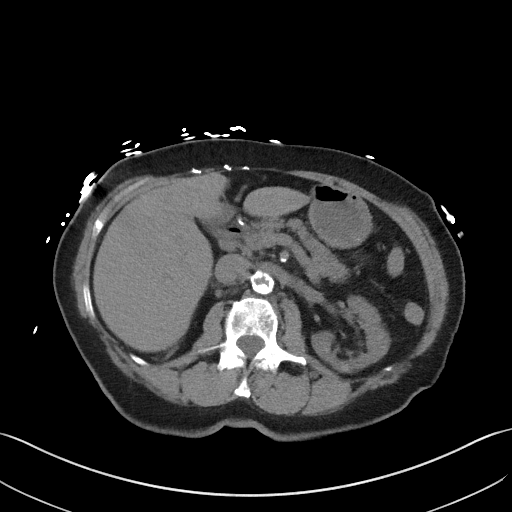
[im 57/85  bone]
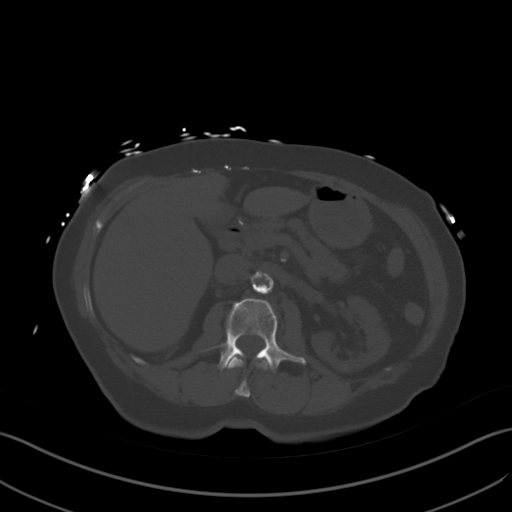
[im 60/85  soft-tissue]
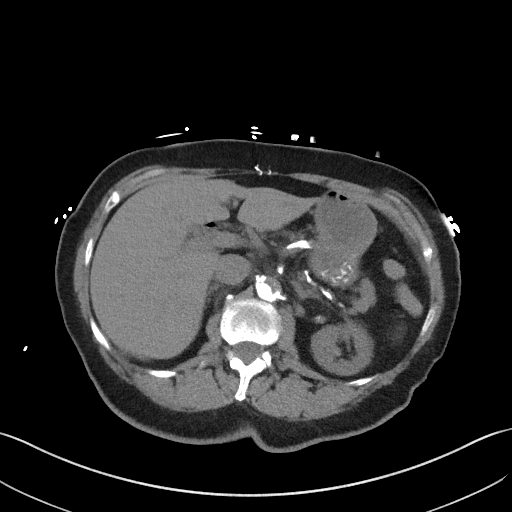
[im 67/85  soft-tissue]
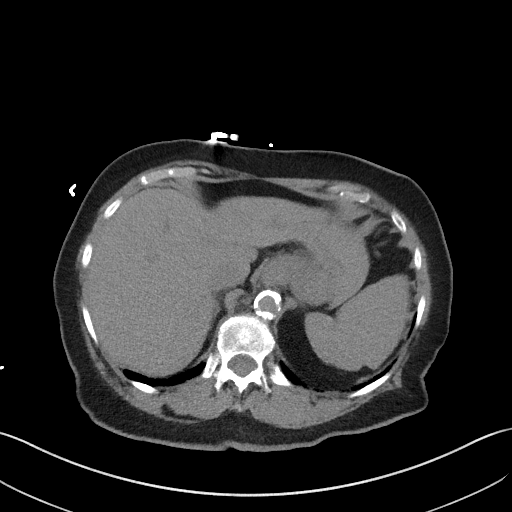
[im 74/85  soft-tissue]
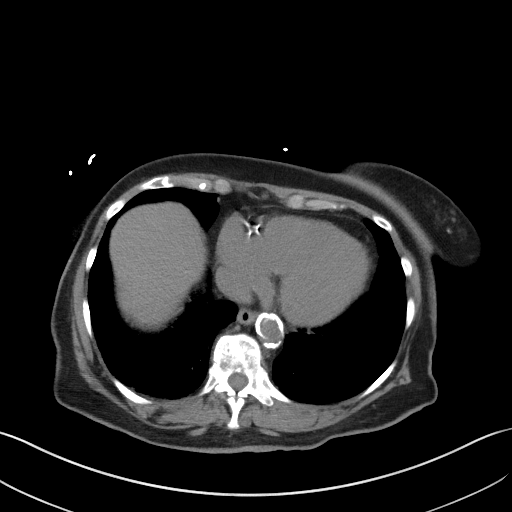
[im 81/85  soft-tissue]
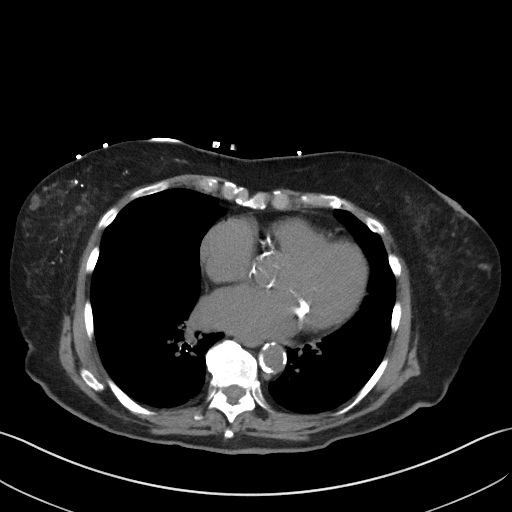

[Series 5: coronal st · coronal · 0.68mm/px · 3 of 79 slices shown]
[im 27/79  soft-tissue]
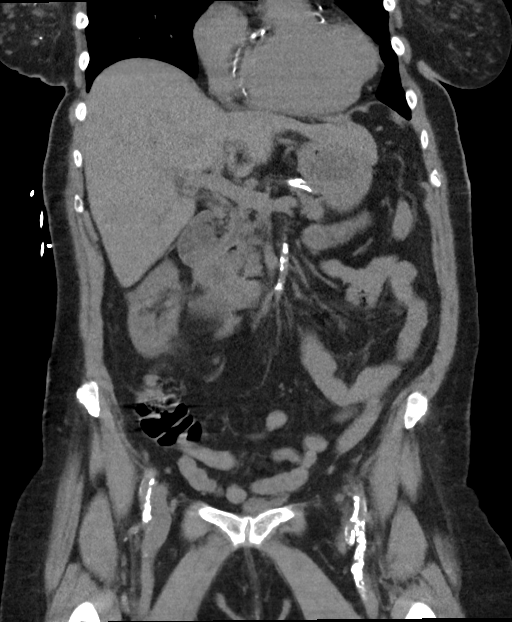
[im 35/79  soft-tissue]
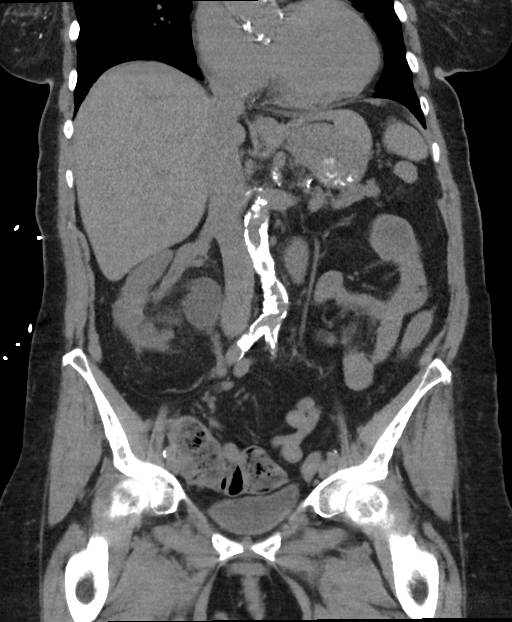
[im 44/79  soft-tissue]
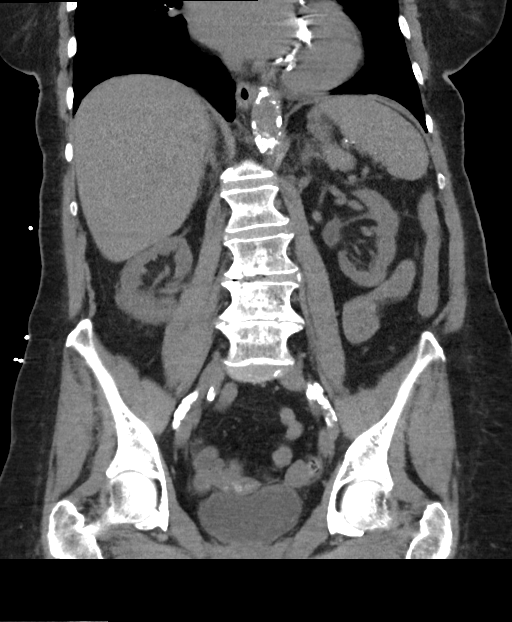

[16 of 46 positions shown; findings below may reference images not displayed]

FINDINGS: Evaluation of this exam is limited in the absence of intravenous
contrast.

Lower chest: The visualized lung bases are clear. Multi vessel
coronary vascular calcification as well as calcification of the
mitral annulus and aortic valve.

No intra-abdominal free air or free fluid.

Hepatobiliary: The liver is unremarkable. Prior cholecystectomy with
air in the CBD and mild pneumobilia. No retained stone noted in the
central CBD.

Pancreas: Unremarkable. No pancreatic ductal dilatation or
surrounding inflammatory changes.

Spleen: Normal in size without focal abnormality.

Adrenals/Urinary Tract: The adrenal glands are unremarkable.
Congenital incomplete rotation of the right kidney. There is a right
extrarenal pelvis with mild chronic pelvicaliectasis similar to
prior CT. No stone. There is no hydronephrosis or nephrolithiasis on
the left. The visualized ureters and urinary bladder appear
unremarkable.

Stomach/Bowel: There is sigmoid diverticulosis with muscular
hypertrophy. No active inflammatory changes. There is no bowel
obstruction or active inflammation. The appendix is not visualized
with certainty. No inflammatory changes identified in the right
lower quadrant.

Vascular/Lymphatic: Advanced aortoiliac atherosclerotic disease with
focal area of high-grade narrowing of the lumen of the aorta at the
level of the renal arteries. This is similar to prior CT. Evaluation
of the vasculature is limited in the absence of intravenous
contrast. The IVC is unremarkable. No portal venous gas. There is no
adenopathy.

Reproductive: Hysterectomy.  No pelvic mass.

Other: None

Musculoskeletal: There is osteopenia with degenerative changes of
the spine. No acute osseous pathology.
IMPRESSION: 1. Sigmoid diverticulosis. No bowel obstruction or active
inflammation.
2. Right extrarenal pelvis with chronic mild pelvicaliectasis
similar to prior CT. No renal calculi.
3. Advanced Aortic Atherosclerosis ([id number]).

## 2019-05-15 IMAGING — US US RENAL
1 series · 14 of 25 positions shown · non-contrast
Comparison: Abdominal and pelvic noncontrast CT scan August 12, 2017

CLINICAL DATA: Chronic renal disease

EXAM:
RENAL / URINARY TRACT ULTRASOUND COMPLETE

[Series 1: us renal · 0.23mm/px · 14 of 42 slices shown]
[im 1/42]
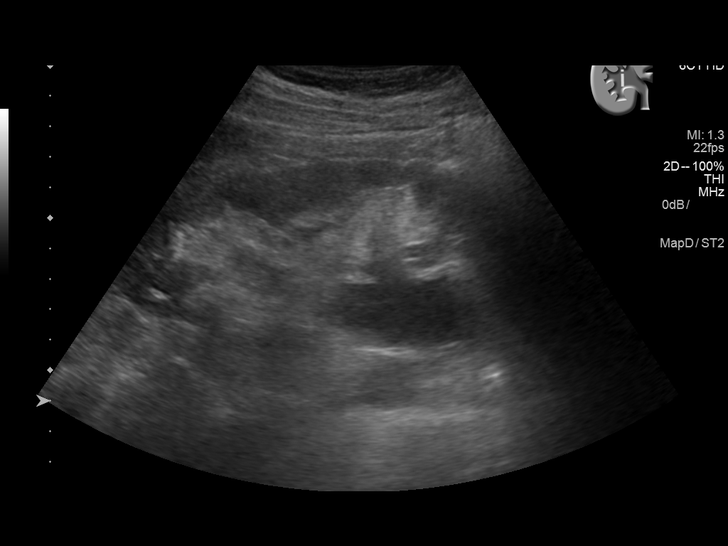
[im 4/42]
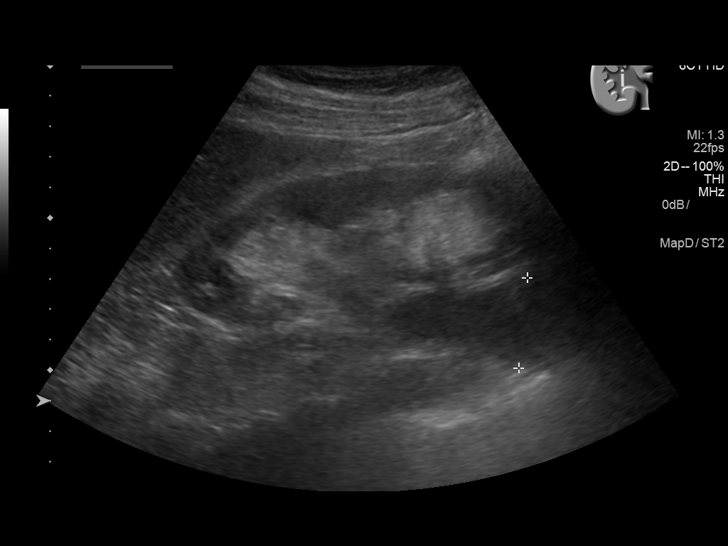
[im 7/42]
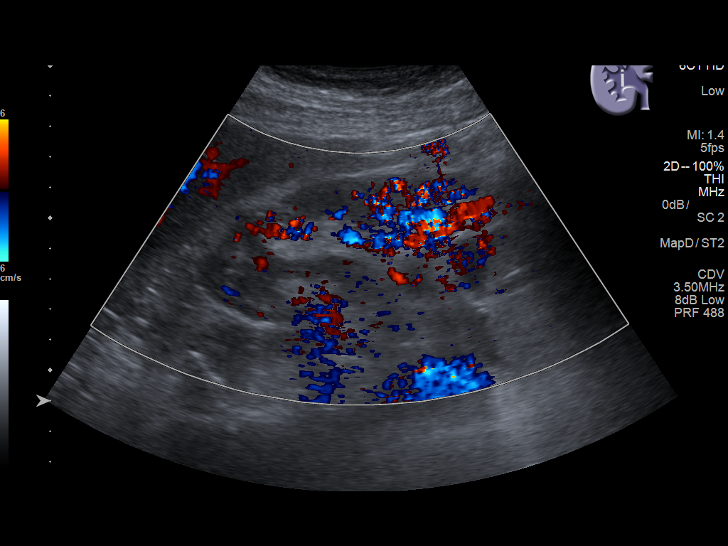
[im 11/42]
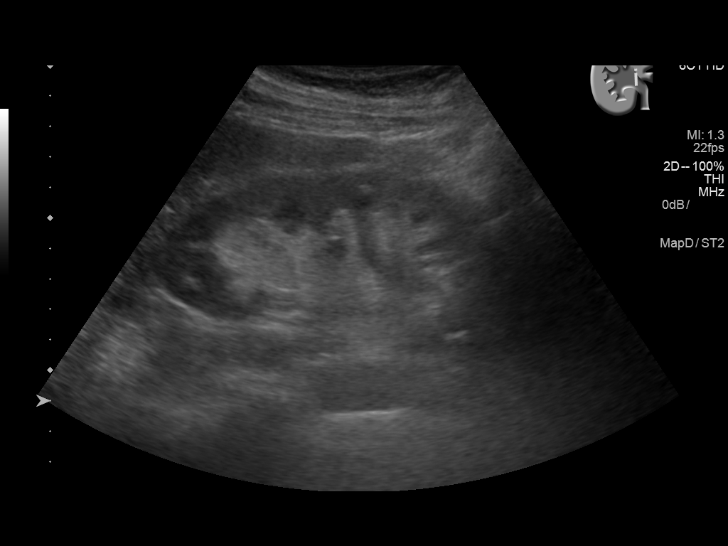
[im 14/42]
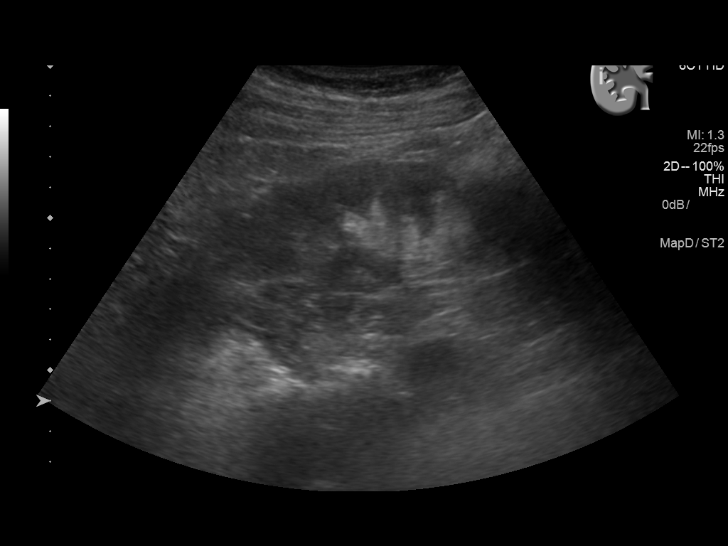
[im 16/42]
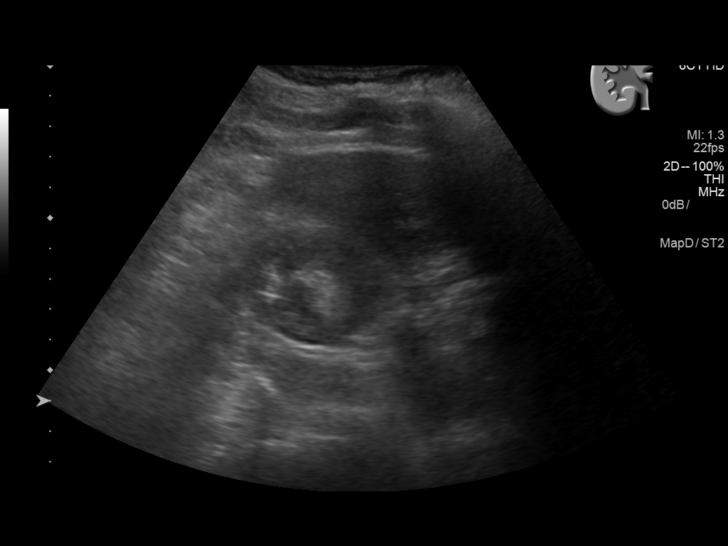
[im 19/42]
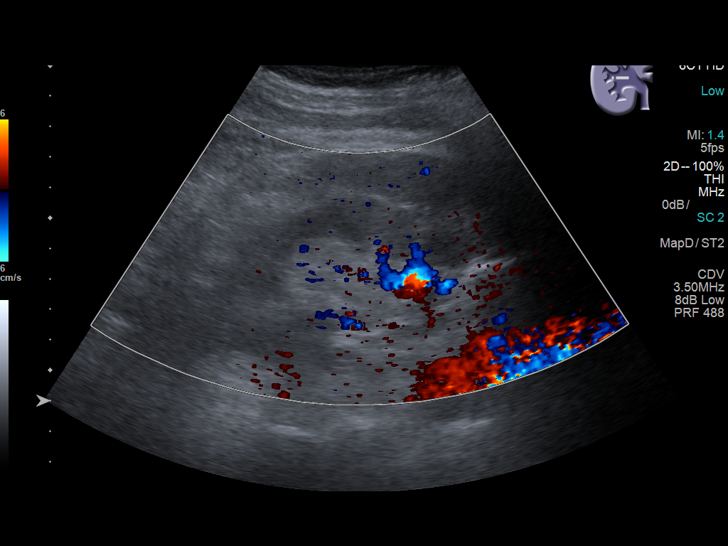
[im 23/42]
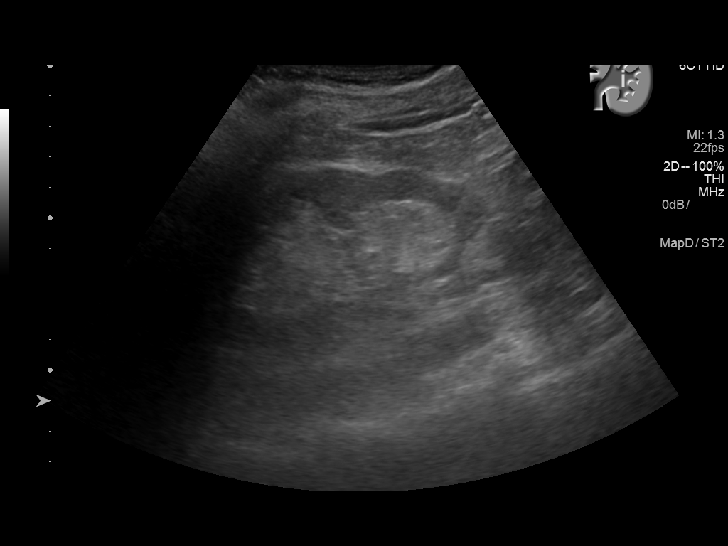
[im 26/42]
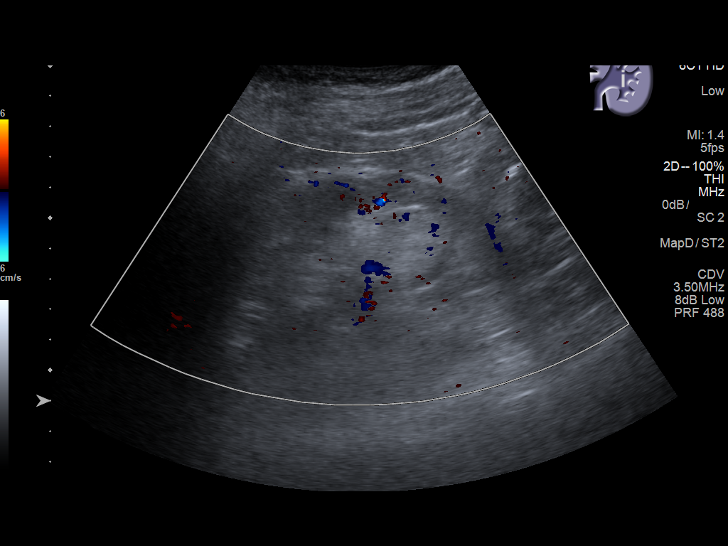
[im 28/42]
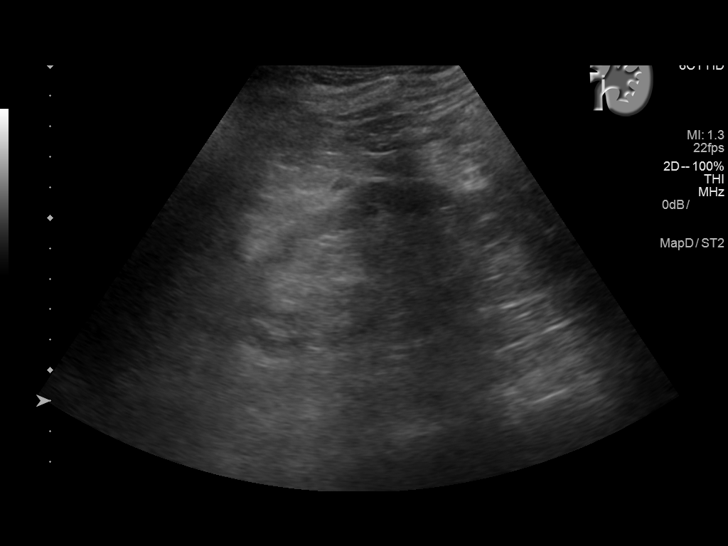
[im 31/42]
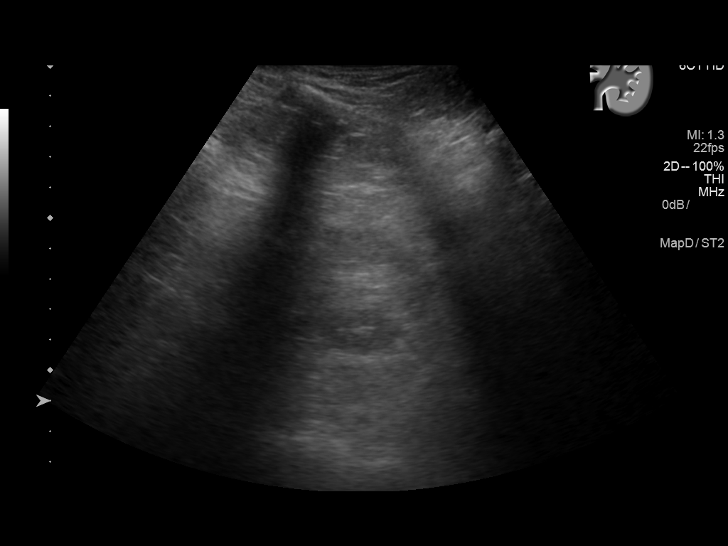
[im 35/42]
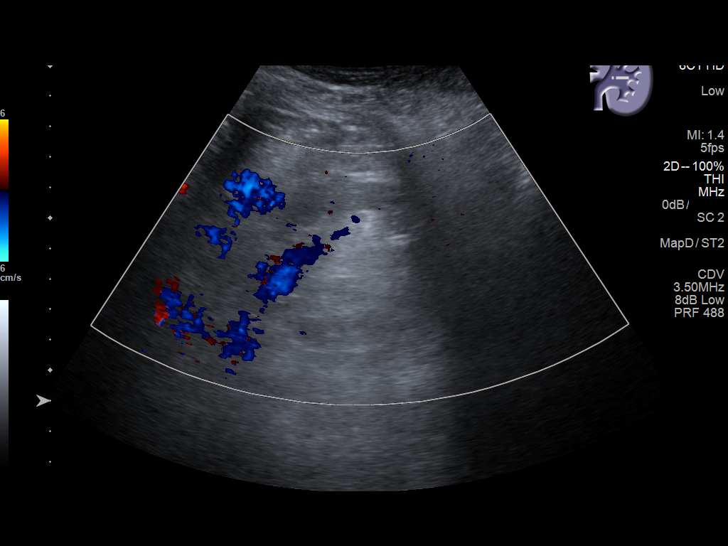
[im 38/42]
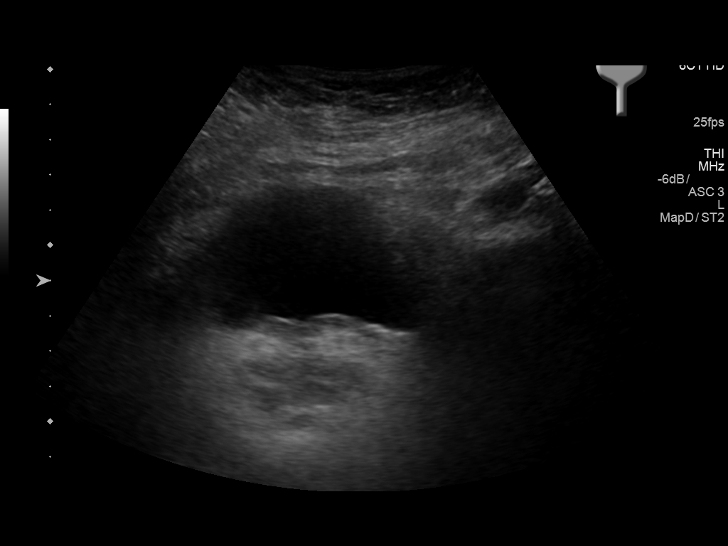
[im 42/42]
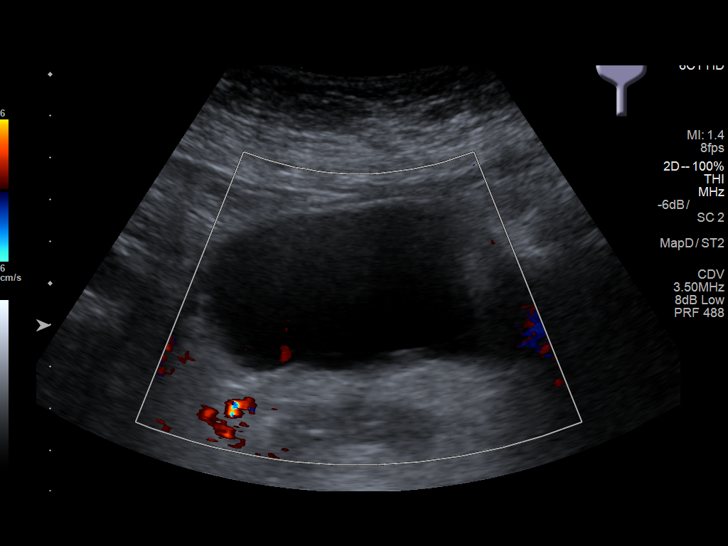

[14 of 25 positions shown; findings below may reference images not displayed]

FINDINGS: Right Kidney:

Length: 12.9 cm. There is an extrarenal pelvis versus mild
hydronephrosis on the right. The renal cortical echotexture is
increased and is approximately equal to that of the adjacent liver.

Left Kidney:

Length: 10.2 cm. There is no hydronephrosis. The renal cortical
echotexture is similar to that on the right.

Bladder:

The partially distended urinary bladder is unremarkable.
IMPRESSION: Mildly increased renal cortical echotexture bilaterally consistent
with medical renal disease. Extrarenal pelvis versus mild
hydronephrosis on the right which has been previously described.

## 2021-03-13 DEATH — deceased
